# Patient Record
Sex: Male | Born: 1985
Health system: Southern US, Community
[De-identification: ages and names within clinical notes are randomized; demographics above are authoritative.]

## PROBLEM LIST (undated history)

## (undated) DIAGNOSIS — K219 Gastro-esophageal reflux disease without esophagitis: Secondary | ICD-10-CM

## (undated) DIAGNOSIS — R569 Unspecified convulsions: Secondary | ICD-10-CM

## (undated) DIAGNOSIS — E785 Hyperlipidemia, unspecified: Secondary | ICD-10-CM

## (undated) DIAGNOSIS — G5600 Carpal tunnel syndrome, unspecified upper limb: Secondary | ICD-10-CM

## (undated) DIAGNOSIS — F419 Anxiety disorder, unspecified: Secondary | ICD-10-CM

## (undated) DIAGNOSIS — K900 Celiac disease: Secondary | ICD-10-CM

## (undated) DIAGNOSIS — R0789 Other chest pain: Secondary | ICD-10-CM

## (undated) DIAGNOSIS — E1110 Type 2 diabetes mellitus with ketoacidosis without coma: Secondary | ICD-10-CM

## (undated) DIAGNOSIS — L0591 Pilonidal cyst without abscess: Secondary | ICD-10-CM

## (undated) DIAGNOSIS — E039 Hypothyroidism, unspecified: Secondary | ICD-10-CM

## (undated) HISTORY — DX: Anxiety disorder, unspecified: F41.9

## (undated) HISTORY — DX: Type 2 diabetes mellitus with ketoacidosis without coma: E11.10

## (undated) HISTORY — DX: Gastro-esophageal reflux disease without esophagitis: K21.9

## (undated) HISTORY — DX: Celiac disease: K90.0

## (undated) HISTORY — DX: Hyperlipidemia, unspecified: E78.5

## (undated) HISTORY — DX: Hypothyroidism, unspecified: E03.9

## (undated) HISTORY — DX: Unspecified convulsions: R56.9

## (undated) HISTORY — DX: Pilonidal cyst without abscess: L05.91

## (undated) HISTORY — DX: Carpal tunnel syndrome, unspecified upper limb: G56.00

## (undated) HISTORY — PX: OTHER SURGICAL HISTORY: SHX169

## (undated) HISTORY — DX: Other chest pain: R07.89

---
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
## 2007-04-05 DIAGNOSIS — G5600 Carpal tunnel syndrome, unspecified upper limb: Secondary | ICD-10-CM

## 2007-04-05 HISTORY — DX: Carpal tunnel syndrome, unspecified upper limb: G56.00

## 2007-11-24 ENCOUNTER — Emergency Department (HOSPITAL_COMMUNITY): Admission: EM | Admit: 2007-11-24 | Discharge: 2007-11-24 | Payer: Self-pay | Admitting: Emergency Medicine

## 2008-05-17 ENCOUNTER — Emergency Department (HOSPITAL_COMMUNITY): Admission: EM | Admit: 2008-05-17 | Discharge: 2008-05-17 | Payer: Self-pay | Admitting: Emergency Medicine

## 2009-05-31 ENCOUNTER — Emergency Department (HOSPITAL_COMMUNITY): Admission: EM | Admit: 2009-05-31 | Discharge: 2009-05-31 | Payer: Self-pay | Admitting: Family Medicine

## 2010-04-25 ENCOUNTER — Encounter: Payer: Self-pay | Admitting: Endocrinology

## 2010-06-25 LAB — POCT INFECTIOUS MONO SCREEN: Mono Screen: POSITIVE — AB

## 2010-06-25 LAB — POCT RAPID STREP A (OFFICE): Streptococcus, Group A Screen (Direct): NEGATIVE

## 2010-07-20 LAB — BASIC METABOLIC PANEL
BUN: 19 mg/dL (ref 6–23)
CO2: 26 mEq/L (ref 19–32)
Calcium: 9.2 mg/dL (ref 8.4–10.5)
GFR calc non Af Amer: >60 mL/min (ref >60)
Glucose, Bld: 287 mg/dL — ABNORMAL HIGH (ref 70–99)
Potassium: 4.1 mEq/L (ref 3.5–5.1)
Sodium: 134 mEq/L — ABNORMAL LOW (ref 135–145)

## 2010-07-20 LAB — DIFFERENTIAL
Basophils Absolute: 0.1 10*3/uL (ref 0.0–0.1)
Basophils Relative: 1 % (ref 0–1)
Eosinophils Relative: 1 % (ref 0–5)
Lymphocytes Relative: 6 % — ABNORMAL LOW (ref 12–46)
Monocytes Absolute: 0.8 10*3/uL (ref 0.1–1.0)

## 2010-07-20 LAB — URINALYSIS, ROUTINE W REFLEX MICROSCOPIC
Bilirubin Urine: NEGATIVE
Glucose, UA: >1000 mg/dL — AB
Hgb urine dipstick: NEGATIVE
Ketones, ur: 40 mg/dL — AB
Leukocytes, UA: NEGATIVE
Nitrite: NEGATIVE
Protein, ur: NEGATIVE mg/dL
Specific Gravity, Urine: 1.04 — ABNORMAL HIGH (ref 1.005–1.030)
Urobilinogen, UA: 1 mg/dL (ref 0.0–1.0)
pH: 6 (ref 5.0–8.0)

## 2010-07-20 LAB — GLUCOSE, CAPILLARY: Glucose-Capillary: 278 mg/dL — ABNORMAL HIGH (ref 70–99)

## 2010-07-20 LAB — CBC
HCT: 46 % (ref 39.0–52.0)
Hemoglobin: 16.4 g/dL (ref 13.0–17.0)
MCHC: 35.6 g/dL (ref 30.0–36.0)
Platelets: 207 10*3/uL (ref 150–400)
RDW: 12.3 % (ref 11.5–15.5)

## 2010-07-20 LAB — URINE MICROSCOPIC-ADD ON

## 2010-07-20 LAB — MAGNESIUM: Magnesium: 2 mg/dL (ref 1.5–2.5)

## 2010-08-10 ENCOUNTER — Ambulatory Visit (HOSPITAL_COMMUNITY)
Admission: EM | Admit: 2010-08-10 | Discharge: 2010-08-11 | Discharge status: Against Medical Advice | Payer: 59 | Attending: Emergency Medicine | Admitting: Emergency Medicine

## 2010-08-10 DIAGNOSIS — R109 Unspecified abdominal pain: Secondary | ICD-10-CM | POA: Insufficient documentation

## 2010-08-10 DIAGNOSIS — E119 Type 2 diabetes mellitus without complications: Secondary | ICD-10-CM | POA: Insufficient documentation

## 2010-08-10 LAB — GLUCOSE, CAPILLARY: Glucose-Capillary: 162 mg/dL — ABNORMAL HIGH (ref 70–99)

## 2010-08-20 NOTE — Discharge Summary (Signed)
NAME:  [name], [name]                        ACCOUNT NO.:  [id number]   MEDICAL RECORD NO.:  [id number]                   PATIENT TYPE:  INP   LOCATION:  6127                                 FACILITY:  [facility]   PHYSICIAN:  Gerrianne Scale, M.D.            DATE OF BIRTH:  1986-02-04   DATE OF ADMISSION:  04/26/2002  DATE OF DISCHARGE:  04/29/2002                                 DISCHARGE SUMMARY   CONSULTATIONS:  Dr. Evlyn Kanner, endocrinology.   PRINCIPAL DIAGNOSES:  1. Diabetic ketoacidosis.  2. Type 1 diabetes.   PROCEDURE:  None.   LABORATORY DATA:  ABG on admission was 7.35 with a PCO2 of 42.2 and a CO2 of  25.  Chemistry shows a sodium of 132, potassium 4.2, chloride 105, CO2 23,  BUN 24, creatinine 0.9.  Urinalysis showed moderate ketones, negative WBC  and large glucose. At the time of discharge chemistry showed a sodium 136,  potassium 4.1, chloride 110, bicarbonate of 20, BUN 11, creatinine 0.8.  Urine ketones were negative.  Hemoglobin A1c was 9.7.   HOSPITAL COURSE:  In brief patient is a 25 year old Caucasian male with a  history of type 1 diabetes times five years who presented for admission on  April 27, 2002 with several episodes of vomiting and abdominal pain.  The  patient's blood sugars at homoe were running 500 to 600.  The patient had  polydipsia and polyuria, therefore, patient was admitted for further  evaluation and observation.   Once hospitalized patient was noted to have elevated anion gap and large  urine ketones therefore, patient was admitted to the pediatric intensive  care unit for insulin therapy infusion.  The patient's home insulin pump was  discontinued and the patient was started on an insulin drip of 0.05 units  per kilo per hour.  The patient was aggressively rehydrated with  approximately two times maintenance IV fluid.  Over the course of  approximately 10 hours the patient's gap closed and insulin drip was  discontinued.  The patient  was restarted on his home insulin pump regimen  and sliding scale, carb counting.  At the time of discharge the patient was  tolerating a regular diet with no evidence of vomiting or abdominal pain.  Blood sugars ranged between 118 to 232 in the 24 hours preceding discharge.  The patient's primary endocrinologist, Dr. Evlyn Kanner and her colleagues were  available to consult during this hospitalization.  They agreed that at this  time they would defer from making any adjustments in his current regimen.   DISCHARGE INSTRUCTIONS:  The patient will be discharged home on a regular  diet with carb counting as previously instructed.   DISCHARGE MEDICATIONS:  Novolog insulin pump at 1.5 units an hour from 12  a.m. to 8 a.m., 0.9 units an hour from 8 a.m. to 2 p.m. and 0.7 units from 2  p.m. to 12 a.m.  In addition, patient  will carb count from 7 a.m. to 3 p.m.  with total carbs divided by 8 and from 2 p.m. to bedtime total carbs divided  by 10.  The patient also has a sliding scale for correction bolus which his  total blood sugar -100 (divided by 30).   DISCHARGE INSTRUCTIONS:  The patient was instructed to return to the  hospital or primary care physician for persistent vomiting, decreased  alertness or abdominal pain.    FOLLOW UP:  The patient will follow up with Dr. Evlyn Kanner in the near future.  Dr. Evlyn Kanner will notify patient regarding follow up appointment.     Oswaldo Done, M.D.    AC/MEDQ  D:  04/29/2002  T:  04/29/2002  Job:  [id number]

## 2010-08-20 NOTE — Discharge Summary (Signed)
NAME:  [name], [name]              ACCOUNT NO.:  [id number]   MEDICAL RECORD NO.:  [id number]          PATIENT TYPE:  INP   LOCATION:  A332                          FACILITY:  [facility]   PHYSICIAN:  Scott A. Gerda Diss, MD    DATE OF BIRTH:  Nov 26, 1985   DATE OF ADMISSION:  05/10/2004  DATE OF DISCHARGE:  02/08/2006LH                                 DISCHARGE SUMMARY   DISCHARGE DIAGNOSES:  1.  Dehydration.  2.  Gastroenteritis.  3.  Viral syndrome.  4.  Chronic sinusitis.  5.  Juvenile insulin-dependent diabetes mellitus type 1 with elevated      glucoses.   HOSPITAL COURSE:  The patient was admitted in after having a poor p.o.  intake of a viral illness over the past week with some intermittent vomiting  the past couple of days, poor urinary output, and orthostatic in the office.  Weight from May 04, 2004-May 10, 2004 went from 143 to 139.  The  patient was admitted in, given IV fluids, and IV antibiotics.  The patient  gradually improved.  Glucoses came down.  The patient was able to be kept on  a couple of shots NPH/day along with sliding scale.  He is usually on an  insulin pump at home.  He was treated with antibiotics.  He was able to keep  things down and the 8th, he was considered stable enough to be sent home.  He was discharged to home on antibiotics.   FOLLOW UP:  He was instructed to follow up in the office in approximately  one week, sooner if problems.  Otherwise follow up with Dr. Evlyn Kanner for a  diabetes check up.  He will finish up his Augmentin twice a day for the next  seven days.      SAL/MEDQ  D:  05/25/2004  T:  05/25/2004  Job:  [id number]

## 2010-08-20 NOTE — H&P (Signed)
NAME:  [name], [name]                        ACCOUNT NO.:  [id number]   MEDICAL RECORD NO.:  [id number]                   PATIENT TYPE:  OBV   LOCATION:  A329                                 FACILITY:  [facility]   PHYSICIAN:  Scott A. Gerda Diss, M.D.               DATE OF BIRTH:  07-28-1985   DATE OF ADMISSION:  05/27/2003  DATE OF DISCHARGE:                                HISTORY & PHYSICAL   CHIEF COMPLAINT:  Fever, nausea, dizziness.   HISTORY OF PRESENT ILLNESS:  This is a 25 year old male who presents with  fever, headache, muscle aches, slight cough, moderate nausea, mild head  congestion, not feeling good; and recently had wisdom teeth taken out on  Friday.  Placed on penicillin 4 times a day.  Also using Entex as needed for  congestion and Motrin for fever.  Felt dizzy when standing up the past 1-1/2  days, not urinating as much as normal.  Sugars have been n a good range.  He  is an insulin-dependent diabetic with an insulin pump.   PAST MEDICAL HISTORY:  Insulin-dependent diabetes with insulin pump.  Normal  pediatric health otherwise. Up to date on immunizations.   FAMILY HISTORY:  Noncontributory.   REVIEW OF SYSTEMS:  See per above.   PHYSICAL EXAMINATION:  GENERAL:  NAD.  HEENT:  MM moist.  NECK:  Supple.  CHEST:  CTA.  HEART:  Regular.  ABDOMEN:  Soft.  No guarding, no rebound.  EXTREMITIES:  No edema.   MET-7:  Potassium looks good, sodium low at 133, BUN and creatinine looks  good. Serum ketones are negative.  White count is normal.  Urine ketones 2+.  The patient is orthostatic in the office.   ASSESSMENT AND PLAN:  1. Febrile viral illness--should gradual resolve over the course of the next     48 hours hopefully.  2. Insulin-dependent diabetes.  Good control currently, but certainly high-     risk for diabetic ketoacidosis with 2+ urine ketones feel that the     patient does need IV fluids.  Especially since he is nauseated and can     not keep anything down  and has a history of diabetic ketoacidosis.  Admit     him to the hospital and treat accordingly--see orders.    ___________________________________________                                         Jonna Coup Gerda Diss, M.D.   Linus Orn  D:  05/27/2003  T:  05/27/2003  Job:  [id number]

## 2010-08-20 NOTE — Discharge Summary (Signed)
NAME:  [name], [name]                        ACCOUNT NO.:  [id number]   MEDICAL RECORD NO.:  [id number]                   PATIENT TYPE:  OBV   LOCATION:  A329                                 FACILITY:  [facility]   PHYSICIAN:  Scott A. Gerda Diss, M.D.               DATE OF BIRTH:  1985-04-26   DATE OF ADMISSION:  05/27/2003  DATE OF DISCHARGE:                                 DISCHARGE SUMMARY   DISCHARGE DIAGNOSES:  1. Febrile illness and viral syndrome.  2. Gastroenteritis.  3. Diabetes type I, insulin dependent.  4. Ketonuria.   HOSPITAL COURSE:  A 25 year old male who was admitted after 1-1/2 days of  fever, nausea, unable to eat anything and drink anything.  Decreased  urination and ketones in his urine.  Is a type I diabetic and was  orthostatic in the office.  Admitted in for IV fluids.  Serum ketones were  negative.  Laboratory work was followed closely and no abnormalities.  The  patient had improved urination and hydration after IV fluids, was able to  nibble some on the evening February 22.  On the morning of February 23, able  to eat better and drink better, was stable and felt able to be discharged to  home.   DISPOSITION:  Discharged to home.   DISCHARGE INSTRUCTIONS:  Instructed to follow up in the office.   CONDITION ON DISCHARGE:  Ongoing troubles are high glucoses.     ___________________________________________                                         Jonna Coup Gerda Diss, M.D.   Linus Orn  D:  05/28/2003  T:  05/28/2003  Job:  [id number]

## 2010-08-20 NOTE — H&P (Signed)
NAME:  [name], [name]              ACCOUNT NO.:  [id number]   MEDICAL RECORD NO.:  [id number]          PATIENT TYPE:  INP   LOCATION:  A332                          FACILITY:  [facility]   PHYSICIAN:  Scott A. Gerda Diss, MD    DATE OF BIRTH:  [birth year]   DATE OF ADMISSION:  05/10/2004  DATE OF DISCHARGE:  LH                                HISTORY & PHYSICAL   CHIEF COMPLAINT:  Vomiting, poor p.o. intake, weight loss, upper respiratory  symptoms.   HISTORY OF PRESENT ILLNESS:  This is a 25 year old white male who is a type  1 diabetic who has had approximately a 7-day history of head congestion,  drainage, cough, sore throat, vomiting times one, poor p.o. intake, poor  liquid intake, feeling very fatigued also having significant weakness,  states sugars are anywhere from the 200 to 400 range.  The patient was seen  on January 31 and was 143.6, then seen on February 6 and was 139.6.  Mother  had mother-in-law bring the child in to be seen on February 6.  He was seen  by our nurse practitioner, was given a shot of Rocephin and started on  antibiotics.  The patient throughout the rest of the day though was very  lethargic.  Mother called back stating the young man was lethargic, not  eating well, not drinking well.  She could not get him to even drink having  a hard time getting him more awake.  Stated his sugars were running in the  4000 range.  She had to give him boluses of insulin to bring it down.  At  this point the mother was instructed to bring the child back in.  The child  was admitted into the hospital with mild dehydration and weight loss and  poor control of type 1 diabetes along with upper respiratory illness.   PAST MEDICAL HISTORY:  1.  Type 1 diabetes since 1999.  2.  Febrile seizures as a toddler.  3.  Frequent otitis as a toddler.  4.  Up to date on immunizations including flu vaccine.   ALLERGIES:  CODEINE makes him nauseous.   MEDICATIONS:  Zoloft 50 mg daily.  Patient  also on insulin pump managed by  Dr. Evlyn Kanner.   PHYSICAL EXAMINATION:  TM's NL, C-NL, sinus mild tenderness.  NECK:  Supple.  LUNGS:  Clear.  HEART:  Regular.  ABDOMEN:  Soft.   ASSESSMENT/PLAN:  1.  Mild dehydration with weight loss. Give a  bolus of IV fluids and then      maintenance IV fluids.  Encourage p.o. intake.  2.  Diabetes, poor control.  Place on sliding scale along with two doses of      NPH per day.  Disengage pump while      in hospital.  Encourage p.o. intake.  3.  Sinusitis.  Rocephin 1 g IV daily.  4.  Check for ketones as well.      SAL/MEDQ  D:  05/11/2004  T:  05/11/2004  Job:  [id number]

## 2010-08-20 NOTE — Consult Note (Signed)
[facility]  Patient:    [name], [name] Visit Number: [id number] MRN: [id number]          Service Type: PED Location: PEDS [phone] Attending Physician:  Julian Hy Dictated by:   Rodrigo Ran, M.D. Admit Date:  07/02/2001   CC:         Lilyan Punt, M.D.   Consultation Report  PRIMARY CARE PHYSICIAN:  Pediatrician, Lilyan Punt, M.D.  ENDOCRINOLOGY:  Tera Mater. [place], M.D.  CHIEF COMPLAINT:  Hyperglycemia and nausea.  HISTORY OF PRESENT ILLNESS:  The patient is a pleasant 25 year old young man with type 1 diabetes for the last four years. He had an episode approximately one month ago, which began with the abrupt onset of hyperglycemia followed by nausea and vomiting. At that time, he was treated in the emergency room with 2 liters of saline intravenously with prompt resolution of his symptoms. He had done well up until Sunday evening when he developed a similar syndrome. This began with elevated blood sugars on Sunday evening up in the 400s, and then to high readings which are normally greater than 500 with his glucometer. He began giving corrective boluses; however, his sugars remained high and he developed x1 episode on early Monday morning. Throughout the morning hours of Monday, he continued to have high blood sugars in the 400s or more despite repeated corrective boluses of insulin. This did not improve even with changes of his insulin pump site and changes to a new vial of insulin. Subsequently, he was brought to the emergency room where he was found to be dehydrated and hyperglycemic without evidence of diabetic ketoacidosis. He was treated with insulin and 4 liters of normal saline. He was able to keep down oral intake and was therefore discharged home with a blood sugar of 330. Despite this, within 2-3 hours his sugars rapidly went back up into upper of 400s to the high readings, and despite a few more corrective boluses  his sugars remained quite high. Therefore, he presented back to the emergency room and was required admission for treatment of his hyperglycemia.  PAST MEDICAL HISTORY: 1. Type 1 diabetes since age 89. 2. No surgical history. 3. Admitted on April 01, 2001 with a low sugar seizure. Last admission in    the emergency room was approximately one month ago, with hyperglycemia    followed by nausea, vomiting, and this resolved quickly with treatment as    noted above.  ALLERGIES:  CODEINE which causes him to become wild.  MEDICATIONS:  He is on a NovoLog insulin pump with occasional corrective doses of Humalog given subcutaneously. He is on no other medicines but did take one dose of Phenergan in the last 24 hours. Between breakfast and 3 p.m. he normally divides total carbohydrates by 8 to give premeal boluses. His basal insulin rate is 0.8 units per hour of NovoLog. After 3 p.m., he divides his carbohydrate total by 10 with each meal. His high correction, if his sugars are greater than 200, he subtracts 100, and then divides this by 30 and gives this as an additional corrective amount of insulin. Typically, he uses 30-35 units of total insulin a day in addition to his basal rate. He has used well over 60 units in the last 24 hours.  SOCIAL HISTORY:  He has one younger brother named Gerri Spore, who is 33 years old. The patient is an Insurance underwriter at [employer]. His parents are divorced. His mother is alive  at age 28 and is healthy except for hypertension. His father is alive and did have some liver problem previously but is currently doing well.  REVIEW OF SYSTEMS:  Joushua felt quite well up until the last 24 hours. He has had no recent prodromal symptoms, or fevers, chills, or nausea and vomiting prior to this episode. He has had normal bowel movements and no GU complaints.   PHYSICAL EXAMINATION:  VITAL SIGNS:  Blood sugar 448. Now, with 1-1/2 hours of 6 units per hour  of regular insulin drip, it is down to 306. His blood pressure is 126/62, pulse 72, temperature 97.3, 98% saturation on room air with a respiratory rate of 18.  GENERAL:  He is semisupine in no acute distress.  HEENT:  There is no icterus. Pupils are equally round and reactive to light. Extraocular movements are intact.  LUNGS:  Clear to auscultation bilaterally.  HEART:  Regular with no murmur, rub, or gallop.  ABDOMEN:  Soft and mildly tender. There are normal active bowel sounds. There is no hepatosplenomegaly. Pump site is without sign of infection.  EXTREMITIES:  There is no cyanosis, clubbing, or edema.  LABORATORY AND ACCESSORY DATA:  ABG with a pH of 7.39 from this afternoon, with a CO2 of 40 and a bicarb of 24. Hemoglobin was 14.3, platelet count 282,000, white count 6.2 with a normal differential. Sodium was low at 127, potassium 4.0, chloride 95, CO2 23, BUN 18, creatinine 0.8, glucose 525 with normal LFTs.  ASSESSMENT AND PLAN:  A 25 year old male patient with type 1 diabetes with persistent hyperglycemia of unclear cause. This is not responsive to changing pump site and changing vials of insulin. There is no clear sign of diabetic ketoacidosis, but the patient may be in a hyperosmolar nonketotic state. We will treat with normal saline intravenously at 50 cc an hour and a regular insulin drip. As his CBGs come under 200, we will add in a D-5 water drip. We will ask our pediatrics teaching service to help facilitate his admission. We will follow the patient during this admission. Dictated by:   Rodrigo Ran, M.D. Attending Physician:  Julian Hy DD:  07/03/01 TD:  07/03/01 Job: [id number]

## 2010-12-20 ENCOUNTER — Emergency Department (HOSPITAL_COMMUNITY)
Admission: EM | Admit: 2010-12-20 | Discharge: 2010-12-21 | Disposition: A | Discharge status: Home / Self Care | Payer: 59 | Attending: Emergency Medicine | Admitting: Emergency Medicine

## 2010-12-20 ENCOUNTER — Encounter: Payer: Self-pay | Admitting: *Deleted

## 2010-12-20 DIAGNOSIS — M25559 Pain in unspecified hip: Secondary | ICD-10-CM | POA: Insufficient documentation

## 2010-12-20 DIAGNOSIS — F17200 Nicotine dependence, unspecified, uncomplicated: Secondary | ICD-10-CM | POA: Insufficient documentation

## 2010-12-20 DIAGNOSIS — E119 Type 2 diabetes mellitus without complications: Secondary | ICD-10-CM | POA: Insufficient documentation

## 2010-12-20 DIAGNOSIS — L539 Erythematous condition, unspecified: Secondary | ICD-10-CM | POA: Insufficient documentation

## 2010-12-20 DIAGNOSIS — IMO0002 Reserved for concepts with insufficient information to code with codable children: Secondary | ICD-10-CM | POA: Insufficient documentation

## 2010-12-20 DIAGNOSIS — Y92410 Unspecified street and highway as the place of occurrence of the external cause: Secondary | ICD-10-CM | POA: Insufficient documentation

## 2010-12-20 LAB — GLUCOSE, CAPILLARY: Glucose-Capillary: 63 mg/dL — ABNORMAL LOW (ref 70–99)

## 2010-12-20 NOTE — ED Notes (Signed)
MD at bedside to evaluate. Patient logrolled off of backboard while maintaining C-Spine. Denies any needs at this time.
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
## 2010-12-20 NOTE — ED Notes (Signed)
fbs 54 by ems.

## 2010-12-21 ENCOUNTER — Emergency Department (HOSPITAL_COMMUNITY)
Admission: EM | Admit: 2010-12-21 | Discharge: 2010-12-21 | Disposition: A | Discharge status: Home / Self Care | Payer: No Typology Code available for payment source | Attending: Emergency Medicine | Admitting: Emergency Medicine

## 2010-12-21 ENCOUNTER — Emergency Department (HOSPITAL_COMMUNITY): Payer: No Typology Code available for payment source

## 2010-12-21 ENCOUNTER — Emergency Department (HOSPITAL_COMMUNITY): Payer: 59

## 2010-12-21 ENCOUNTER — Encounter (HOSPITAL_COMMUNITY): Payer: Self-pay | Admitting: Emergency Medicine

## 2010-12-21 DIAGNOSIS — R51 Headache: Secondary | ICD-10-CM

## 2010-12-21 DIAGNOSIS — S20219A Contusion of unspecified front wall of thorax, initial encounter: Secondary | ICD-10-CM

## 2010-12-21 LAB — GLUCOSE, CAPILLARY: Glucose-Capillary: 165 mg/dL — ABNORMAL HIGH (ref 70–99)

## 2010-12-21 MED ORDER — HYDROCODONE-ACETAMINOPHEN 7.5-500 MG/15ML PO SOLN: 15.0000 mL | Freq: Four times a day (QID) | ORAL | Status: AC | PRN

## 2010-12-21 MED ORDER — HYDROCODONE-ACETAMINOPHEN 5-325 MG PO TABS
1.0000 | ORAL_TABLET | Freq: Once | ORAL | Status: AC
  Administered 2010-12-21: 1 via ORAL
  Filled 2010-12-21: qty 1

## 2010-12-21 MED ORDER — METHOCARBAMOL 500 MG PO TABS
1000.0000 mg | ORAL_TABLET | Freq: Once | ORAL | Status: AC
  Administered 2010-12-21: 1000 mg via ORAL
  Filled 2010-12-21: qty 2

## 2010-12-21 MED ORDER — CYCLOBENZAPRINE HCL 10 MG PO TABS: 10.0000 mg | ORAL_TABLET | Freq: Two times a day (BID) | ORAL | Status: AC | PRN

## 2010-12-21 MED ORDER — IBUPROFEN 800 MG PO TABS
800.0000 mg | ORAL_TABLET | Freq: Once | ORAL | Status: AC
  Administered 2010-12-21: 800 mg via ORAL
  Filled 2010-12-21: qty 1

## 2010-12-21 MED ORDER — IBUPROFEN 800 MG PO TABS: 800.0000 mg | ORAL_TABLET | Freq: Three times a day (TID) | ORAL | Status: AC

## 2010-12-21 NOTE — ED Notes (Signed)
Into room to see patient. Denies any pain. Given one can diet coke per request. Denies any other needs. Will continue to monitor.

## 2010-12-21 NOTE — ED Notes (Signed)
No deformity to left upper thigh. Strong petal pulses.

## 2010-12-21 NOTE — ED Notes (Signed)
MD at bedside to speak with patient about plan of care.

## 2010-12-21 NOTE — ED Notes (Signed)
Pt was seen here last night for the same. Pt c/o rt rib pain, head pain, and left leg pain.
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
## 2010-12-21 NOTE — ED Notes (Signed)
Patient ambulated well in hall with no difficulties.

## 2010-12-21 NOTE — ED Notes (Signed)
Patient to radiology via stretcher

## 2010-12-21 NOTE — ED Notes (Signed)
Patient back to room from radiology. Pain 0\10. Denies any needs at this time. Call bell and family at bedside.
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
## 2010-12-28 NOTE — ED Provider Notes (Signed)
Medical screening examination/treatment/procedure(s) were performed by non-physician practitioner and as supervising physician I was immediately available for consultation/collaboration.   Keldric Poyer R Kharee Lesesne, MD 12/28/10 1121 

## 2011-01-21 ENCOUNTER — Emergency Department (HOSPITAL_COMMUNITY): Payer: No Typology Code available for payment source

## 2011-01-21 ENCOUNTER — Emergency Department (HOSPITAL_COMMUNITY)
Admission: EM | Admit: 2011-01-21 | Discharge: 2011-01-22 | Disposition: A | Discharge status: Home / Self Care | Payer: No Typology Code available for payment source | Attending: Emergency Medicine | Admitting: Emergency Medicine

## 2011-01-21 ENCOUNTER — Encounter (HOSPITAL_COMMUNITY): Payer: Self-pay | Admitting: *Deleted

## 2011-01-21 DIAGNOSIS — R071 Chest pain on breathing: Secondary | ICD-10-CM | POA: Insufficient documentation

## 2011-01-21 DIAGNOSIS — R0789 Other chest pain: Secondary | ICD-10-CM

## 2011-01-21 DIAGNOSIS — S161XXA Strain of muscle, fascia and tendon at neck level, initial encounter: Secondary | ICD-10-CM

## 2011-01-21 DIAGNOSIS — IMO0001 Reserved for inherently not codable concepts without codable children: Secondary | ICD-10-CM | POA: Insufficient documentation

## 2011-01-21 DIAGNOSIS — F17200 Nicotine dependence, unspecified, uncomplicated: Secondary | ICD-10-CM | POA: Insufficient documentation

## 2011-01-21 DIAGNOSIS — E119 Type 2 diabetes mellitus without complications: Secondary | ICD-10-CM | POA: Insufficient documentation

## 2011-01-21 DIAGNOSIS — S139XXA Sprain of joints and ligaments of unspecified parts of neck, initial encounter: Secondary | ICD-10-CM | POA: Insufficient documentation
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
## 2011-01-22 ENCOUNTER — Encounter (HOSPITAL_COMMUNITY): Payer: Self-pay | Admitting: Emergency Medicine

## 2011-01-22 MED ORDER — METHOCARBAMOL 500 MG PO TABS: ORAL_TABLET | ORAL | Status: DC

## 2011-01-22 MED ORDER — IBUPROFEN 800 MG PO TABS: 800.0000 mg | ORAL_TABLET | Freq: Three times a day (TID) | ORAL | Status: AC
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
## 2011-01-22 NOTE — ED Notes (Signed)
Pt left the er stating no needs 

## 2011-02-01 NOTE — ED Provider Notes (Signed)
Medical screening examination/treatment/procedure(s) were performed by non-physician practitioner and as supervising physician I was immediately available for consultation/collaboration.  Atianna Haidar, MD 02/01/11 1713 

## 2011-03-11 ENCOUNTER — Ambulatory Visit (HOSPITAL_COMMUNITY)
Admission: RE | Admit: 2011-03-11 | Discharge: 2011-03-11 | Disposition: A | Discharge status: Home / Self Care | Payer: No Typology Code available for payment source | Source: Ambulatory Visit | Attending: Family Medicine | Admitting: Family Medicine

## 2011-03-11 DIAGNOSIS — IMO0001 Reserved for inherently not codable concepts without codable children: Secondary | ICD-10-CM | POA: Insufficient documentation

## 2011-03-11 DIAGNOSIS — M546 Pain in thoracic spine: Secondary | ICD-10-CM | POA: Insufficient documentation

## 2011-03-11 DIAGNOSIS — E119 Type 2 diabetes mellitus without complications: Secondary | ICD-10-CM | POA: Insufficient documentation

## 2011-03-11 DIAGNOSIS — M542 Cervicalgia: Secondary | ICD-10-CM | POA: Insufficient documentation
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
## 2011-03-16 ENCOUNTER — Ambulatory Visit (HOSPITAL_COMMUNITY)
Admission: RE | Admit: 2011-03-16 | Discharge: 2011-03-16 | Disposition: A | Discharge status: Home / Self Care | Payer: No Typology Code available for payment source | Source: Ambulatory Visit | Attending: Family Medicine | Admitting: Family Medicine

## 2011-03-16 NOTE — Progress Notes (Signed)
Physical Therapy Treatment Patient Details  Name: [name] MRN: [id number] Date of Birth: 04-25-1985  Today's Date: 03/16/2011 Time: 4098-1191 Time Calculation (min): 42 min Visit#: 2  of 6   Re-eval: 04/01/11 Charges:  therex 24', manual (STM) 15'    Subjective: Symptoms/Limitations Symptoms: Pt. states last visit helped.  Reports his pain is about a 3/10 in his R rib cage area. Pain Assessment Currently in Pain?: Yes Pain Score:   3 Pain Location: Thoracic Pain Orientation: Right  Exercise/Treatments Stretches Active Hamstring Stretch: 2 reps;30 seconds Active Hamstring Stretch Limitations: BLE Lower Trunk Rotation: 5 reps;20 seconds Lower Trunk Rotation Limitations: Thoracic rotation stretch Prone Mid Back Stretch: 3 reps;30 seconds;Limitations Prone Mid Back Stretch Limitations: seated ITB Stretch: 2 reps;30 seconds ITB Stretch Limitations: BLE Machine Exercises UBE (Upper Arm Bike): 4' backward only  Manual Therapy Manual Therapy:  (Prone position X 15 minutes)  Physical Therapy Assessment and Plan PT Assessment and Plan Clinical Impression Statement: Pt. reported painfree following STM; Large spasms B scapular and L trap area.  Able to resolve all 100% except L scapular spasm.  Added midback stretch without difficulty. PT Plan: Assess pain next visit; add tband postural three and progress to prone scapular/thoracic strengthening.     Problem List Patient Active Problem List  Diagnoses  . Pain in thoracic spine  . Cervical pain    PT - End of Session Activity Tolerance: Patient tolerated treatment well General Behavior During Session: [facility] for tasks performed Cognition: [facility] for tasks performed  Emeline Gins B 03/16/2011, 8:53 AM

## 2011-03-18 ENCOUNTER — Ambulatory Visit (HOSPITAL_COMMUNITY)
Admission: RE | Admit: 2011-03-18 | Discharge: 2011-03-18 | Disposition: A | Discharge status: Home / Self Care | Payer: No Typology Code available for payment source | Source: Ambulatory Visit

## 2011-03-18 NOTE — Progress Notes (Signed)
Physical Therapy Treatment Patient Details  Name: [name] MRN: [id number] Date of Birth: 1986/01/05  Today's Date: 03/18/2011 Time: 1478-2956 Time Calculation (min): 40 min Visit#: 3  of 6   Re-eval: 04/01/11  Charge: therex 26 min Manual 12 min  Subjective: Symptoms/Limitations Symptoms: Pt stated shoulders are feeling fine, only pain R rib cage pain scale 2-3/10. Pain Assessment Currently in Pain?: Yes Pain Score:   3 Pain Location: Rib cage Pain Orientation: Right  Objective:   Exercise/Treatments Stretches Active Hamstring Stretch: 2 reps;30 seconds Active Hamstring Stretch Limitations: BLE Lower Trunk Rotation: 5 reps;30 seconds Lower Trunk Rotation Limitations: Thoracic rotation stretch Prone Mid Back Stretch: 2 reps;60 seconds ITB Stretch: 2 reps;30 seconds ITB Stretch Limitations: BLE Lumbar Exercises Scapular Retraction: AROM;10 reps;Standing;Theraband;Limitations Theraband Level (Scapular Retraction): Level 3 (Green) Scapular Retraction Limitations: 5" holds Row: 10 reps;Standing;Theraband;Limitations;Prone Theraband Level (Row): Level 3 (Green) Row Limitations: 5" holds Shoulder Extension: 10 reps;Standing;Theraband Theraband Level (Shoulder Extension): Level 3 (Green)  Manual Therapy Manual Therapy: Other (comment) Other Manual Therapy: STM B scapular, upper trap and R rib cage x 12 min  Physical Therapy Assessment and Plan PT Assessment and Plan Clinical Impression Statement: Added tband postural strengthening exercises, pt able to complete correctly with min cueing for proper form.  Large spasms L scapular and upper trap area, R rib cage, able to resolve with relief stated following. PT Plan: Assess pain next session.  Continue progressing scapular/thoracic strengthening/stretches.    Goals    Problem List Patient Active Problem List  Diagnoses  . Pain in thoracic spine  . Cervical pain    PT - End of Session Activity Tolerance:  Patient tolerated treatment well General Behavior During Session: [facility] for tasks performed Cognition: [facility] for tasks performed  Juel Burrow 03/18/2011, 2:05 PM

## 2011-03-22 ENCOUNTER — Telehealth (HOSPITAL_COMMUNITY): Payer: Self-pay

## 2011-03-22 ENCOUNTER — Ambulatory Visit (HOSPITAL_COMMUNITY): Payer: 59

## 2011-03-25 ENCOUNTER — Inpatient Hospital Stay (HOSPITAL_COMMUNITY): Admission: RE | Admit: 2011-03-25 | Payer: 59 | Source: Ambulatory Visit

## 2011-03-30 ENCOUNTER — Inpatient Hospital Stay (HOSPITAL_COMMUNITY): Admission: RE | Admit: 2011-03-30 | Payer: 59 | Source: Ambulatory Visit

## 2011-03-30 ENCOUNTER — Telehealth (HOSPITAL_COMMUNITY): Payer: Self-pay

## 2011-04-01 ENCOUNTER — Ambulatory Visit (HOSPITAL_COMMUNITY): Payer: 59 | Admitting: Physical Therapy

## 2011-04-07 ENCOUNTER — Ambulatory Visit (HOSPITAL_COMMUNITY)
Admission: RE | Admit: 2011-04-07 | Discharge: 2011-04-07 | Disposition: A | Discharge status: Home / Self Care | Payer: No Typology Code available for payment source | Source: Ambulatory Visit | Attending: Family Medicine | Admitting: Family Medicine

## 2011-04-07 DIAGNOSIS — E119 Type 2 diabetes mellitus without complications: Secondary | ICD-10-CM | POA: Insufficient documentation

## 2011-04-07 DIAGNOSIS — M542 Cervicalgia: Secondary | ICD-10-CM | POA: Insufficient documentation

## 2011-04-07 DIAGNOSIS — IMO0001 Reserved for inherently not codable concepts without codable children: Secondary | ICD-10-CM | POA: Insufficient documentation

## 2011-04-07 DIAGNOSIS — M546 Pain in thoracic spine: Secondary | ICD-10-CM | POA: Insufficient documentation

## 2011-04-07 NOTE — Progress Notes (Addendum)
Physical Therapy Treatment Patient Details  Name: [name] MRN: [id number] Date of Birth: [birth year]  Today's Date: 04/07/2011 Time: 8295-6213 Time Calculation (min): 34 min Visit#: 4  of 6   Charges: ROMM x 1 Pt education (Self care) x 8' Therex x 18'  Subjective: Symptoms/Limitations Symptoms: Pt reports that he is pain free and ready to D/C. Pain Assessment Currently in Pain?: No/denies Pain Score: 0-No pain  Objective:  04/07/11 0800  Lumbar AROM  Lumbar Flexion Decreased 10%  Lumbar Extension WNL  Lumbar - Right Side Bend WNL  Lumbar - Left Side Bend WNL  Lumbar - Right Rotation WNL  Lumbar - Left Rotation WNL     Exercise/Treatments Stretches Active Hamstring Stretch: 1 rep;20 seconds Active Hamstring Stretch Limitations: BLE for HEP Lower Trunk Rotation: 1 rep;30 seconds Lower Trunk Rotation Limitations: For HEP compliance Prone Mid Back Stretch: 1 rep;30 seconds Prone Mid Back Stretch Limitations: For HEP ITB Stretch: 1 rep;30 seconds ITB Stretch Limitations: BLE for HEP Lumbar Exercises Scapular Retraction: 10 reps Theraband Level (Scapular Retraction): Level 3 (Green) Row: 10 reps Theraband Level (Row): Level 3 (Green) Shoulder Extension: 10 reps;Standing;Theraband Theraband Level (Shoulder Extension): Level 3 (Green) Stability Single Arm Raise: 10 reps Leg Raise: 10 reps Opposite Arm/Leg Raise: 10 reps  Physical Therapy Assessment and Plan PT Assessment and Plan Clinical Impression Statement: HEP exercises reviewed to ensure proper technique. New scapular strengthening exercises completed and given for HEP. Pt complete exercises with proper form with minimal need for cueing. Pt with improved ROM and dereased spasm and pain. PT Plan: Recommend D/C to PT. Per all goals met and pt request.    Goals Home Exercise Program Pt will Perform Home Exercise Program: Independently PT Goal: Perform Home Exercise Program - Progress: Met PT Short Term  Goals Time to Complete Short Term Goals: 3 weeks PT Short Term Goal 1: Pt will report pain less than a 4/10 for 50% of his day.  PT Short Term Goal 1 - Progress: Met PT Short Term Goal 2: Pt will improve lumbar flexion by 25%. PT Short Term Goal 2 - Progress: Met PT Short Term Goal 3: Pt will present with minimal spasm to his thoracic and cervical musculature in order to have improve L cervical and thoracic rotation when driving. PT Short Term Goal 3 - Progress: Met PT Short Term Goal 4: Pt will improve postural endurance in order to maintain appropriate posture during an entire therapy regime PT Short Term Goal 4 - Progress: Met  Problem List Patient Active Problem List  Diagnoses  . Pain in thoracic spine  . Cervical pain    PT - End of Session Activity Tolerance: Patient tolerated treatment well General Behavior During Session: [facility] for tasks performed Cognition: [facility] for tasks performed  Antonieta Iba 04/07/2011, 8:54 AM

## 2012-10-11 ENCOUNTER — Telehealth: Payer: Self-pay | Admitting: Family Medicine

## 2012-10-11 NOTE — Telephone Encounter (Signed)
Patient needs new prescription for Aldara Cream.He uses [facility].

## 2012-10-12 ENCOUNTER — Other Ambulatory Visit: Payer: Self-pay | Admitting: Nurse Practitioner

## 2012-10-12 MED ORDER — IMIQUIMOD 5 % EX CREA: TOPICAL_CREAM | CUTANEOUS | Status: DC

## 2013-02-19 ENCOUNTER — Ambulatory Visit (INDEPENDENT_AMBULATORY_CARE_PROVIDER_SITE_OTHER): Payer: 59 | Admitting: Family Medicine

## 2013-02-19 ENCOUNTER — Encounter: Payer: Self-pay | Admitting: Family Medicine

## 2013-02-19 VITALS — BP 112/80 | Temp 98.2°F | Ht 67.0 in | Wt 150.2 lb

## 2013-02-19 DIAGNOSIS — N179 Acute kidney failure, unspecified: Secondary | ICD-10-CM | POA: Insufficient documentation

## 2013-02-19 DIAGNOSIS — L0390 Cellulitis, unspecified: Secondary | ICD-10-CM | POA: Insufficient documentation

## 2013-02-19 DIAGNOSIS — J019 Acute sinusitis, unspecified: Secondary | ICD-10-CM

## 2013-02-19 DIAGNOSIS — Z23 Encounter for immunization: Secondary | ICD-10-CM

## 2013-02-19 MED ORDER — HYDROCODONE-ACETAMINOPHEN 7.5-325 MG PO TABS: 1.0000 | ORAL_TABLET | Freq: Four times a day (QID) | ORAL | Status: DC | PRN

## 2013-02-19 MED ORDER — LEVOFLOXACIN 500 MG PO TABS: 500.0000 mg | ORAL_TABLET | Freq: Every day | ORAL | Status: DC

## 2013-02-19 NOTE — Addendum Note (Signed)
Addended by: Lilyan Punt A on: 02/19/2013 01:24 PM   Modules accepted: Orders, Medications

## 2013-02-19 NOTE — Progress Notes (Signed)
  Subjective:    Patient ID: [name], male    DOB: 1985-08-30, 27 y.o.   MRN: [id number]  Cough This is a new problem. The current episode started in the past 7 days. The cough is productive of sputum. Associated symptoms include nasal congestion, postnasal drip and rhinorrhea. Associated symptoms comments: Eye drainage. The symptoms are aggravated by pollens. Treatments tried: Claritin. The treatment provided no relief. His past medical history is significant for environmental allergies.   Present for a week. No V, no fevers, no wheeze   Review of Systems  HENT: Positive for postnasal drip and rhinorrhea.   Respiratory: Positive for cough.   Allergic/Immunologic: Positive for environmental allergies.       Objective:   Physical Exam  Nursing note and vitals reviewed. Constitutional: He appears well-developed.  HENT:  Head: Normocephalic.  Mouth/Throat: Oropharynx is clear and moist. No oropharyngeal exudate.  Neck: Normal range of motion.  Cardiovascular: Normal rate, regular rhythm and normal heart sounds.   No murmur heard. Pulmonary/Chest: Effort normal and breath sounds normal. He has no wheezes.  Lymphadenopathy:    He has no cervical adenopathy.  Neurological: He exhibits normal muscle tone.  Skin: Skin is warm and dry.          Assessment & Plan:  Sinusitis antibiotics prescribed warning signs discussed followup if problems Diabetes care per endocrinology

## 2013-02-23 ENCOUNTER — Encounter: Payer: Self-pay | Admitting: *Deleted

## 2013-02-25 ENCOUNTER — Ambulatory Visit: Payer: 59 | Admitting: Family Medicine

## 2013-07-25 ENCOUNTER — Encounter (HOSPITAL_COMMUNITY): Payer: Self-pay | Admitting: Emergency Medicine

## 2013-07-25 ENCOUNTER — Encounter: Payer: Self-pay | Admitting: Nurse Practitioner

## 2013-07-25 ENCOUNTER — Emergency Department (HOSPITAL_COMMUNITY)
Admission: EM | Admit: 2013-07-25 | Discharge: 2013-07-25 | Disposition: A | Discharge status: Home / Self Care | Payer: 59 | Attending: Emergency Medicine | Admitting: Emergency Medicine

## 2013-07-25 ENCOUNTER — Ambulatory Visit (INDEPENDENT_AMBULATORY_CARE_PROVIDER_SITE_OTHER): Payer: 59 | Admitting: Nurse Practitioner

## 2013-07-25 VITALS — BP 112/72 | Temp 98.3°F | Ht 67.0 in | Wt 147.2 lb

## 2013-07-25 DIAGNOSIS — Z794 Long term (current) use of insulin: Secondary | ICD-10-CM | POA: Insufficient documentation

## 2013-07-25 DIAGNOSIS — E109 Type 1 diabetes mellitus without complications: Secondary | ICD-10-CM | POA: Insufficient documentation

## 2013-07-25 DIAGNOSIS — B029 Zoster without complications: Secondary | ICD-10-CM

## 2013-07-25 DIAGNOSIS — L03019 Cellulitis of unspecified finger: Secondary | ICD-10-CM | POA: Insufficient documentation

## 2013-07-25 DIAGNOSIS — Z792 Long term (current) use of antibiotics: Secondary | ICD-10-CM | POA: Insufficient documentation

## 2013-07-25 DIAGNOSIS — F17200 Nicotine dependence, unspecified, uncomplicated: Secondary | ICD-10-CM | POA: Insufficient documentation

## 2013-07-25 DIAGNOSIS — IMO0001 Reserved for inherently not codable concepts without codable children: Secondary | ICD-10-CM

## 2013-07-25 DIAGNOSIS — L255 Unspecified contact dermatitis due to plants, except food: Secondary | ICD-10-CM | POA: Insufficient documentation

## 2013-07-25 DIAGNOSIS — L237 Allergic contact dermatitis due to plants, except food: Secondary | ICD-10-CM

## 2013-07-25 LAB — CBG MONITORING, ED: Glucose-Capillary: 194 mg/dL — ABNORMAL HIGH (ref 70–99)

## 2013-07-25 MED ORDER — SULFAMETHOXAZOLE-TRIMETHOPRIM 800-160 MG PO TABS: 1.0000 | ORAL_TABLET | Freq: Two times a day (BID) | ORAL | Status: DC

## 2013-07-25 MED ORDER — LIDOCAINE HCL (PF) 2 % IJ SOLN
10.0000 mL | Freq: Once | INTRAMUSCULAR | Status: DC
  Filled 2013-07-25: qty 10

## 2013-07-25 MED ORDER — VALACYCLOVIR HCL 1 G PO TABS: 1000.0000 mg | ORAL_TABLET | Freq: Three times a day (TID) | ORAL | Status: DC

## 2013-07-25 MED ORDER — SULFAMETHOXAZOLE-TMP DS 800-160 MG PO TABS
1.0000 | ORAL_TABLET | Freq: Once | ORAL | Status: AC
  Administered 2013-07-25: 1 via ORAL
  Filled 2013-07-25: qty 1

## 2013-07-25 NOTE — ED Notes (Signed)
Patient has abscess to right middle finger.

## 2013-07-25 NOTE — Patient Instructions (Signed)
Shingles Shingles (herpes zoster) is an infection that is caused by the same virus that causes chickenpox (varicella). The infection causes a painful skin rash and fluid-filled blisters, which eventually break open, crust over, and heal. It may occur in any area of the body, but it usually affects only one side of the body or face. The pain of shingles usually lasts about 1 month. However, some people with shingles may develop long-term (chronic) pain in the affected area of the body. Shingles often occurs many years after the person had chickenpox. It is more common:  In people older than 50 years.  In people with weakened immune systems, such as those with HIV, AIDS, or cancer.  In people taking medicines that weaken the immune system, such as transplant medicines.  In people under great stress. CAUSES  Shingles is caused by the varicella zoster virus (VZV), which also causes chickenpox. After a person is infected with the virus, it can remain in the person's body for years in an inactive state (dormant). To cause shingles, the virus reactivates and breaks out as an infection in a nerve root. The virus can be spread from person to person (contagious) through contact with open blisters of the shingles rash. It will only spread to people who have not had chickenpox. When these people are exposed to the virus, they may develop chickenpox. They will not develop shingles. Once the blisters scab over, the person is no longer contagious and cannot spread the virus to others. SYMPTOMS  Shingles shows up in stages. The initial symptoms may be pain, itching, and tingling in an area of the skin. This pain is usually described as burning, stabbing, or throbbing.In a few days or weeks, a painful red rash will appear in the area where the pain, itching, and tingling were felt. The rash is usually on one side of the body in a band or belt-like pattern. Then, the rash usually turns into fluid-filled blisters. They  will scab over and dry up in approximately 2 3 weeks. Flu-like symptoms may also occur with the initial symptoms, the rash, or the blisters. These may include:  Fever.  Chills.  Headache.  Upset stomach. DIAGNOSIS  Your caregiver will perform a skin exam to diagnose shingles. Skin scrapings or fluid samples may also be taken from the blisters. This sample will be examined under a microscope or sent to a lab for further testing. TREATMENT  There is no specific cure for shingles. Your caregiver will likely prescribe medicines to help you manage the pain, recover faster, and avoid long-term problems. This may include antiviral drugs, anti-inflammatory drugs, and pain medicines. HOME CARE INSTRUCTIONS   Take a cool bath or apply cool compresses to the area of the rash or blisters as directed. This may help with the pain and itching.   Only take over-the-counter or prescription medicines as directed by your caregiver.   Rest as directed by your caregiver.  Keep your rash and blisters clean with mild soap and cool water or as directed by your caregiver.  Do not pick your blisters or scratch your rash. Apply an anti-itch cream or numbing creams to the affected area as directed by your caregiver.  Keep your shingles rash covered with a loose bandage (dressing).  Avoid skin contact with:  Babies.   Pregnant women.   Children with eczema.   Elderly people with transplants.   People with chronic illnesses, such as leukemia or AIDS.   Wear loose-fitting clothing to help ease   the pain of material rubbing against the rash.  Keep all follow-up appointments with your caregiver.If the area involved is on your face, you may receive a referral for follow-up to a specialist, such as an eye doctor (ophthalmologist) or an ear, nose, and throat (ENT) doctor. Keeping all follow-up appointments will help you avoid eye complications, chronic pain, or disability.  SEEK IMMEDIATE MEDICAL  CARE IF:   You have facial pain, pain around the eye area, or loss of feeling on one side of your face.  You have ear pain or ringing in your ear.  You have loss of taste.  Your pain is not relieved with prescribed medicines.   Your redness or swelling spreads.   You have more pain and swelling.  Your condition is worsening or has changed.   You have a feveror persistent symptoms for more than 2 3 days.  You have a fever and your symptoms suddenly get worse. MAKE SURE YOU:  Understand these instructions.  Will watch your condition.  Will get help right away if you are not doing well or get worse. Document Released: 03/21/2005 Document Revised: 12/14/2011 Document Reviewed: 11/03/2011 [employer] Patient Information 2014 [employer].  

## 2013-07-25 NOTE — ED Provider Notes (Addendum)
CSN: [id number]     Arrival date & time 07/25/13  0208 History   First MD Initiated Contact with Patient 07/25/13 0217     Chief Complaint  Patient presents with  . Abscess     (Consider location/radiation/quality/duration/timing/severity/associated sxs/prior Treatment) Patient is a 28 y.o. male presenting with abscess. The history is provided by the patient.  Abscess He noted redness and swelling of the distal portion of his right third finger over the last 3 days. It is moderately painful and he rates pain at 7/10. He denies fever or chills. He thinks that he has an abscess and feels that it needs to be drained. He had an injury to the cuticle that finger about 2 weeks ago but denies any trauma since then. Also, he has an itchy area on his back which started yesterday. 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  Past Medical History  Diagnosis Date  . Seizures 1990, 1991    febrile seizures x 3  . Diabetes mellitus 05/1997    type I    Past Surgical History  Procedure Laterality Date  . None    . Tympanostomy tube placement  1990   Family History  Problem Relation Age of Onset  . Heart disease Other    History  Substance Use Topics  . Smoking status: Current Every Day Smoker -- 1.00 packs/day for 10 years    Types: Cigarettes  . Smokeless tobacco: Not on file  . Alcohol Use: Yes     Comment: occasional    Review of Systems  All other systems reviewed and are negative.     Allergies  Codeine and Prednisone  Home Medications   Prior to Admission medications   Medication Sig Start Date End Date Taking? Authorizing Provider  insulin aspart (NOVOLOG) 100 UNIT/ML injection Inject 5-21 Units into the skin 3 (three) times daily before meals. As directed per sliding scale.    Historical Provider, MD  insulin glargine (LANTUS) 100 UNIT/ML injection Inject 38-40 Units into the skin every morning.     Historical  Provider, MD  levofloxacin (LEVAQUIN) 500 MG tablet Take 1 tablet (500 mg total) by mouth daily. 02/19/13   Babs SciaraScott A Luking, MD   BP 120/61  Pulse 66  Temp(Src) 97.8 F (36.6 C) (Oral)  Resp 18  Ht 5\' 7"  (1.702 m)  Wt 155 lb (70.308 kg)  BMI 24.27 kg/m2  SpO2 100% Physical Exam  Nursing note and vitals reviewed.  28 year old male, resting comfortably and in no acute distress. Vital signs are normal. Oxygen saturation is 100%, which is normal. Head is normocephalic and atraumatic. PERRLA, EOMI. Oropharynx is clear. Neck is nontender and supple without adenopathy or JVD. Back is nontender and there is no CVA tenderness. Lungs are clear without rales, wheezes, or rhonchi. Chest is nontender. Heart has regular rate and rhythm without murmur. Abdomen is soft, flat, nontender without masses or hepatosplenomegaly and peristalsis is normoactive. Extremities have no cyanosis or edema, full range of motion is present. Paronychia is present in the right third finger and with overlying erythema and mild fluctuance. No lymphangitic streaks are seen. Skin is warm and dry. Erythematous rash is present in the right scapular area with several early vesicles forming given what appears to be a linear pattern. This is consistent with poison ivy dermatitis. Neurologic: Mental status is normal, cranial nerves are intact, there are no motor or sensory deficits.  ED Course  Procedures (including critical care time) INCISION AND DRAINAGE Performed by: Dione Boozeavid Tongela Encinas Consent: Verbal consent obtained. Risks and benefits: risks, benefits and alternatives were discussed Type: abscess  Body area: Right third finger paronychia   Anesthesia: digital block with 2% Lidocaine  Anesthetic total: 10 ml, additional 5 ml used when anesthesia was found to be inadequate  Incision was made with scissors  Complexity: complex Blunt dissection to break up loculations  Drainage: purulent  Drainage amount:  small  Packing material: none  Patient tolerance: Patient tolerated the procedure well with no immediate complications.    Labs Review Results for orders placed during the hospital encounter of 07/25/13  CBG MONITORING, ED      Result Value Ref Range   Glucose-Capillary 194 (*) 70 - 99 mg/dL   MDM   Final diagnoses:  Paronychia of third finger of right hand  Poison ivy dermatitis    Paronychia of right third finger. Rash on the right scapular area consistent with contact dermatitis from something such as poison ivy. Steroids will not be given for the contact dermatitis because of his diabetes. He is advised to use topical hydrocortisone cream. Paronychias incised and drained but only a small pocket of pus was identified. In spite of infection, and blood sugar is only mildly elevated. He is discharged with prescription for sulfamethoxazole-trimethoprim.    Dione Boozeavid Raeden Belzer, MD 07/25/13 [id number]  Dione Boozeavid Cimberly Stoffel, MD 07/25/13 [phone]

## 2013-07-25 NOTE — Discharge Instructions (Signed)
Paronychia Paronychia is an inflammatory reaction involving the folds of the skin surrounding the fingernail. This is commonly caused by an infection in the skin around a nail. The most common cause of paronychia is frequent wetting of the hands (as seen with bartenders, food servers, nurses or others who wet their hands). This makes the skin around the fingernail susceptible to infection by bacteria (germs) or fungus. Other predisposing factors are:  Aggressive manicuring.  Nail biting.  Thumb sucking. The most common cause is a staphylococcal (a type of germ) infection, or a fungal (Candida) infection. When caused by a germ, it usually comes on suddenly with redness, swelling, pus and is often painful. It may get under the nail and form an abscess (collection of pus), or form an abscess around the nail. If the nail itself is infected with a fungus, the treatment is usually prolonged and may require oral medicine for up to one year. Your caregiver will determine the length of time treatment is required. The paronychia caused by bacteria (germs) may largely be avoided by not pulling on hangnails or picking at cuticles. When the infection occurs at the tips of the finger it is called felon. When the cause of paronychia is from the herpes simplex virus (HSV) it is called herpetic whitlow. TREATMENT  When an abscess is present treatment is often incision and drainage. This means that the abscess must be cut open so the pus can get out. When this is done, the following home care instructions should be followed. HOME CARE INSTRUCTIONS   It is important to keep the affected fingers very dry. Rubber or plastic gloves over cotton gloves should be used whenever the hand must be placed in water.  Keep wound clean, dry and dressed as suggested by your caregiver between warm soaks or warm compresses.  Soak in warm water for fifteen to twenty minutes three to four times per day for bacterial infections. Fungal  infections are very difficult to treat, so often require treatment for long periods of time.  For bacterial (germ) infections take antibiotics (medicine which kill germs) as directed and finish the prescription, even if the problem appears to be solved before the medicine is gone.  Only take over-the-counter or prescription medicines for pain, discomfort, or fever as directed by your caregiver. SEEK IMMEDIATE MEDICAL CARE IF:  You have redness, swelling, or increasing pain in the wound.  You notice pus coming from the wound.  You have a fever.  You notice a bad smell coming from the wound or dressing. Document Released: 09/14/2000 Document Revised: 06/13/2011 Document Reviewed: 05/16/2008 [facility] Patient Information 2014 [place].  Poison [employer] Poison ivy is a inflammation of the skin (contact dermatitis) caused by touching the allergens on the leaves of the ivy plant following previous exposure to the plant. The rash usually appears 48 hours after exposure. The rash is usually bumps (papules) or blisters (vesicles) in a linear pattern. Depending on your own sensitivity, the rash may simply cause redness and itching, or it may also progress to blisters which may break open. These must be well cared for to prevent secondary bacterial (germ) infection, followed by scarring. Keep any open areas dry, clean, dressed, and covered with an antibacterial ointment if needed. The eyes may also get puffy. The puffiness is worst in the morning and gets better as the day progresses. This dermatitis usually heals without scarring, within 2 to 3 weeks without treatment. HOME CARE INSTRUCTIONS  Thoroughly wash with soap and water  as soon as you have been exposed to poison ivy. You have about one half hour to remove the plant resin before it will cause the rash. This washing will destroy the oil or antigen on the skin that is causing, or will cause, the rash. Be sure to wash under your fingernails as any  plant resin there will continue to spread the rash. Do not rub skin vigorously when washing affected area. Poison ivy cannot spread if no oil from the plant remains on your body. A rash that has progressed to weeping sores will not spread the rash unless you have not washed thoroughly. It is also important to wash any clothes you have been wearing as these may carry active allergens. The rash will return if you wear the unwashed clothing, even several days later. Avoidance of the plant in the future is the best measure. Poison ivy plant can be recognized by the number of leaves. Generally, poison ivy has three leaves with flowering branches on a single stem. Diphenhydramine may be purchased over the counter and used as needed for itching. Do not drive with this medication if it makes you drowsy.Ask your caregiver about medication for children. SEEK MEDICAL CARE IF:  Open sores develop.  Redness spreads beyond area of rash.  You notice purulent (pus-like) discharge.  You have increased pain.  Other signs of infection develop (such as fever). Document Released: 03/18/2000 Document Revised: 06/13/2011 Document Reviewed: 02/04/2009 [facility] Patient Information 2014 [place], [place].  Sulfamethoxazole; Trimethoprim, SMX-TMP tablets What is this medicine? SULFAMETHOXAZOLE; TRIMETHOPRIM or SMX-TMP (suhl fuh meth OK suh zohl; trye METH oh prim) is a combination of a sulfonamide antibiotic and a second antibiotic, trimethoprim. It is used to treat or prevent certain kinds of bacterial infections. It will not work for colds, flu, or other viral infections. This medicine may be used for other purposes; ask your health care provider or pharmacist if you have questions. COMMON BRAND NAME(S): Bacter-Aid DS , Bactrim DS, Bactrim, Septra DS, Septra What should I tell my health care provider before I take this medicine? They need to know if you have any of these conditions: -anemia -asthma -being treated  with anticonvulsants -if you frequently drink alcohol containing drinks -kidney disease -liver disease -low level of folic acid or [id number] dehydrogenase -poor nutrition or malabsorption -porphyria -severe allergies -thyroid disorder -an unusual or allergic reaction to sulfamethoxazole, trimethoprim, sulfa drugs, other medicines, foods, dyes, or preservatives -pregnant or trying to get pregnant -breast-feeding How should I use this medicine? Take this medicine by mouth with a full glass of water. Follow the directions on the prescription label. Take your medicine at regular intervals. Do not take it more often than directed. Do not skip doses or stop your medicine early. Talk to your pediatrician regarding the use of this medicine in children. Special care may be needed. This medicine has been used in children as young as 52 months of age. Overdosage: If you think you have taken too much of this medicine contact a poison control center or emergency room at once. NOTE: This medicine is only for you. Do not share this medicine with others. What if I miss a dose? If you miss a dose, take it as soon as you can. If it is almost time for your next dose, take only that dose. Do not take double or extra doses. What may interact with this medicine? Do not take this medicine with any of the following medications: -aminobenzoate potassium -dofetilide -metronidazole This medicine  may also interact with the following medications: -ACE inhibitors like benazepril, enalapril, lisinopril, and ramipril -cyclosporine -digoxin -diuretics -indomethacin -medicines for diabetes -methenamine -methotrexate -phenytoin -potassium supplements -pyrimethamine -sulfinpyrazone -tricyclic antidepressants -warfarin This list may not describe all possible interactions. Give your health care provider a list of all the medicines, herbs, non-prescription drugs, or dietary supplements you use. Also tell  them if you smoke, drink alcohol, or use illegal drugs. Some items may interact with your medicine. What should I watch for while using this medicine? Tell your doctor or health care professional if your symptoms do not improve. Drink several glasses of water a day to reduce the risk of kidney problems. Do not treat diarrhea with over the counter products. Contact your doctor if you have diarrhea that lasts more than 2 days or if it is severe and watery. This medicine can make you more sensitive to the sun. Keep out of the sun. If you cannot avoid being in the sun, wear protective clothing and use a sunscreen. Do not use sun lamps or tanning beds/booths. What side effects may I notice from receiving this medicine? Side effects that you should report to your doctor or health care professional as soon as possible: -allergic reactions like skin rash or hives, swelling of the face, lips, or tongue -breathing problems -fever or chills, sore throat -irregular heartbeat, chest pain -joint or muscle pain -pain or difficulty passing urine -red pinpoint spots on skin -redness, blistering, peeling or loosening of the skin, including inside the mouth -unusual bleeding or bruising -unusually weak or tired -yellowing of the eyes or skin Side effects that usually do not require medical attention (report to your doctor or health care professional if they continue or are bothersome): -diarrhea -dizziness -headache -loss of appetite -nausea, vomiting -nervousness This list may not describe all possible side effects. Call your doctor for medical advice about side effects. You may report side effects to FDA at 1-800-FDA-1088. Where should I keep my medicine? Keep out of the reach of children. Store at room temperature between 20 to 25 degrees C (68 to 77 degrees F). Protect from light. Throw away any unused medicine after the expiration date. NOTE: This sheet is a summary. It may not cover all possible  information. If you have questions about this medicine, talk to your doctor, pharmacist, or health care provider.  2014, Elsevier/Gold Standard. (2007-11-21 11:32:51)

## 2013-07-25 NOTE — ED Notes (Signed)
Discharge instructions and prescriptions given and reviewed with patient.  Patient verbalized understanding to complete all antibiotic.  Patient ambulatory; discharged home in good condition.

## 2013-07-28 ENCOUNTER — Encounter: Payer: Self-pay | Admitting: Nurse Practitioner

## 2013-07-28 DIAGNOSIS — L03019 Cellulitis of unspecified finger: Secondary | ICD-10-CM | POA: Insufficient documentation

## 2013-07-28 DIAGNOSIS — B029 Zoster without complications: Secondary | ICD-10-CM | POA: Insufficient documentation

## 2013-07-28 NOTE — Progress Notes (Signed)
Subjective:  Presents after being seen in ED early this morning for paronychia of his right finger. Started on antibiotics. Has a rash on his left upper back area that is been there since Monday morning. Nontender. Mildly pruritic. No known contacts. Was told to ER that is poison ivy rash. No fever. Has type 1 diabetes, sugars are minimally changed according to patient. Has not picked up his antibiotics.  Objective:   BP 112/72  Temp(Src) 98.3 F (36.8 C) (Oral)  Ht 5\' 7"  (1.702 m)  Wt 147 lb 4 oz (66.792 kg)  BMI 23.06 kg/m2 NAD. Alert, oriented. Band-Aid removed from the right middle finger, infection noted around the nailbed. A cluster of discrete slightly raised pink lesions noted in the upper back left side, nontender to palpation. No other rash is noted.  Assessment: Paronychia of finger  Herpes zoster  Plan: Meds ordered this encounter  Medications  . valACYclovir (VALTREX) 1000 MG tablet    Sig: Take 1 tablet (1,000 mg total) by mouth 3 (three) times daily.    Dispense:  21 tablet    Refill:  0    Order Specific Question:  Supervising Provider    Answer:  Merlyn AlbertLUKING, WILLIAM S [[id number]]   Because of patient's history of diabetes, will treat with Valtrex per his request. Patient to pick up antibiotics as soon as possible and start them this morning. Complete antibiotics as directed. Call back next week if no improvement, call or go to ED sooner if worse.

## 2016-03-04 MED FILL — NovoLOG 100 UNIT/ML SOLN: 100 | 30 days supply | Qty: 40 | Fill #0 | Status: TO

## 2016-03-07 ENCOUNTER — Ambulatory Visit: Payer: Self-pay | Admitting: *Deleted

## 2016-03-07 ENCOUNTER — Other Ambulatory Visit: Payer: Self-pay | Admitting: *Deleted

## 2016-03-07 VITALS — BP 110/82 | Ht 67.0 in | Wt 175.0 lb

## 2016-03-07 DIAGNOSIS — E109 Type 1 diabetes mellitus without complications: Secondary | ICD-10-CM

## 2016-03-07 LAB — POCT CBG (FASTING - GLUCOSE)-MANUAL ENTRY: Glucose Fasting, POC: 200 mg/dL — AB (ref 70–99)

## 2016-03-07 LAB — POCT GLYCOSYLATED HEMOGLOBIN (HGB A1C): Hemoglobin A1C: 9.3

## 2016-03-07 NOTE — Patient Outreach (Addendum)
[facility]) Care Management   03/07/2016  Michel SanteeBrandon P Dials 31-Jan-1986 [id number]  Michel SanteeBrandon P Masters is an 30 y.o. male who presents to the [facility] Care Management office with his wife [name] to enroll in the Link To Wellness program for self management assistance with Type I DM.  [name] was previously in the program from 04/18/12 until he turned 28 on 11/23/2013 and lost his insurance with [facility] as a dependent on his Mom's health plan.   Subjective: [name] states his blood sugar control has not been as good since he stopped using the Dexcom continuous glucose monitor due to financial issues and with a recent change in jobs that has kept him from checking his blood sugars as frequently. He says his blood sugars vary greatly, and reports his fasting blood sugar this morning as 240 with a pre-lunch value of 61. He says he has fairly frequent lows and can tolerate severe lows such as 27 and can self treat. He states he has never required assistance to treat a low. He says he uses an Omnipod for continuous insulin delivery and really likes it because it has no tubing and his job is very physical and he is concerned if he changes to a pump with tubes he will be dislodging the infusion port often. He denies any recent emergency room or hospital admissions.  His wife [name] is a Engineer, civil (consulting)nurse with [employer].   Objective:   Review of Systems  Constitutional: Negative.     Physical Exam  Constitutional: He is oriented to person, place, and time. He appears well-developed and well-nourished.  Respiratory: Effort normal.  Neurological: He is alert and oriented to person, place, and time.  Skin: Skin is warm and dry.  Psychiatric: He has a normal mood and affect. His behavior is normal. Judgment and thought content normal.    Encounter Medications:   Outpatient Encounter Prescriptions as of 03/07/2016  Medication Sig  . insulin aspart (NOVOLOG) 100 UNIT/ML  injection Continuous via Omnipod Insulin Pump   No facility-administered encounter medications on file as of 03/07/2016.     Functional Status:   In your present state of health, do you have any difficulty performing the following activities: 03/07/2016  Hearing? N  Vision? N  Difficulty concentrating or making decisions? N  Walking or climbing stairs? N  Dressing or bathing? N  Doing errands, shopping? N  Some recent data might be hidden    Fall/Depression Screening:    PHQ 2/9 Scores 03/07/2016  PHQ - 2 Score 0    Assessment:  Spouse of [employer] employee enrolling in the Link To Wellness program for self management assistance with Type I DM, currently not meeting treatment Hgb A1C as evidenced by POC Hgb A1C= 9.3%.   Plan:  [facility] CM Care Plan Problem One   Flowsheet Row Most Recent Value  Care Plan Problem One Type I DM currently not meeting Hgb A1C goal as evidenced by POC Hgb A1C= 9.3% and significant blood sugar variability per patient report  Role Documenting the Problem One  Care Management Coordinator  Care Plan for Problem One  Active  [facility] Long Term Goal (31-90 days) Improved glycemic control as evidenced by improved Hgb A1C at next assessment with decreased variability and without increased hypoglycemia   [facility] Long Term Goal Start Date  03/07/16  Interventions for Problem One Long Term Goal Discussed Link to [employer] program goals, requirements and benefits, reviewed member's rights and responsibilities,  provided diabetes information packet with explanation of contents, ensured member agreed and signed consent to participate and authorization to release and receive health information, consent, participation agreement and consent to enroll in program, assessed member's current knowledge of Type I diabetes, discussed current Insulin regimen, reviewed blood sugar monitoring and values, discussed changes to the [facility] pharmacy formulary that will impact [name], discussed the  Medtronic 670 G hybrid closed loop insulin delivery system and the benefit coverage (100% as long as [name] remains active in the [employer] program), showed video of the system to ConcreteBrandon and ComptcheAmanda and answered their questions, encouraged [name] to discuss this system and request change to Humalog ( so he will not have a copay) with Dr. Evlyn [facility]anner[facility] when he sees him in January, advised [name] to call [employer] to see what the copay will be if [name] chooses to stay with the Omnipod since it will not be covered at 100% beginning in 2018,  will arrange for Link To Wellness follow up in May 2018 or sooner if needed     Will fax today's note to Dr. Evlyn KannerSouth.  Bary RichardJanet S. Hauser RN,CCM,CDE [facility] Care Management Coordinator Link To Wellness Office Phone [phone] Office Fax [phone]

## 2016-03-10 ENCOUNTER — Encounter: Payer: Self-pay | Admitting: *Deleted

## 2016-03-18 ENCOUNTER — Other Ambulatory Visit: Payer: Self-pay | Admitting: *Deleted

## 2016-03-18 NOTE — Patient Outreach (Signed)
Returned call to [name], she states she received a letter from [facility] Human Resources stating that in 2018 neither she or Apolinar JunesBrandon will be  eligible for [facility] insurance because of her per diem work status. She is asking for directions on how she may secure Dexcom continuous glucose sensors before year's end as Apolinar JunesBrandon  Is currently covered under the health plan until the end of 2017. Advised her to call [employer] to secure the inforamion she will need in order for Dr. Evlyn KannerSouth to supply Apolinar JunesBrandon with a prescription for sensor, etc.  Bary RichardJanet S. Hauser RN,CCM,CDE [facility] Care Management Coordinator Link To Wellness Office Phone [phone] Office Fax [phone]

## 2016-03-24 DIAGNOSIS — E1065 Type 1 diabetes mellitus with hyperglycemia: Secondary | ICD-10-CM | POA: Diagnosis not present

## 2016-03-24 DIAGNOSIS — E1040 Type 1 diabetes mellitus with diabetic neuropathy, unspecified: Secondary | ICD-10-CM | POA: Diagnosis not present

## 2016-03-24 DIAGNOSIS — E109 Type 1 diabetes mellitus without complications: Secondary | ICD-10-CM | POA: Diagnosis not present

## 2016-04-05 ENCOUNTER — Other Ambulatory Visit: Payer: Self-pay | Admitting: *Deleted

## 2016-04-05 NOTE — Patient Outreach (Signed)
[name] is no longer eligible for the link To Wellness program because he no longer has the [facility] Plan under his wife's coverage effective 12/31/7. Therefore, case being closed to Link To Wellness diabetes program. Bary RichardJanet S. Almadelia Looman RN,CCM,CDE [facility] Care Management Coordinator Link To Wellness Office Phone [phone] Office Fax [phone]

## 2017-01-23 ENCOUNTER — Encounter: Payer: Self-pay | Admitting: *Deleted

## 2017-01-23 ENCOUNTER — Other Ambulatory Visit: Payer: Self-pay | Admitting: *Deleted

## 2017-01-23 NOTE — Patient Outreach (Signed)
[facility]) Care Management   01/23/2017  Jacob SanteeBrandon P Carrigg 1986/02/22 [id number]  [name] is an 31 y.o. male who presents to the [facility] Care Management office to re- enroll in the Link To Wellness program for self management assistance with Type I DM. 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  [name] was previously in the program from 04/18/12 until he turned 28 on 11/23/2013 and then again from 03/07/16 until 04/05/16 when he changed to his own company's insurance.   Subjective: [name] states his blood sugar control has not been as good since he stopped using the Dexcom continuous glucose monitor due to difficulty getting the sensors via [employer].  He reports his fasting blood sugar this morning as 140. He  He states he has never required assistance to treat a low. He says he uses an Omnipod for continuous insulin delivery and really likes it because it has no tubing and his job is very physical and he is concerned if he changes to a pump with tubes he will be dislodging the infusion port often. He denies any recent emergency room or hospital admissions.  His wife [name] is a Engineer, civil (consulting)nurse in the flex pool with [employer] and she works 7a-7p.  [name] is asking for a POC Hgb A1C today.    Objective:   Review of Systems  Constitutional: Negative.     Physical Exam  Constitutional: He is oriented to person, place, and time. He appears well-developed and well-nourished.  Respiratory: Effort normal.  Neurological: He is alert and oriented to person, place, and time.  Skin: Skin is warm and dry.  Psychiatric: He has a normal mood and affect. His behavior is normal. Judgment and thought content normal.   Vitals:   01/23/17 1119  Weight: 188 lb (85.3 kg)  Height: 1.702 m (5\' 7" )    POC post prandial CBG= 240 - breakfast of cold cereal with milk with 70 units of coverage pre-meal POC Hgb A1C=8.2% (eAG= 189)  Outpatient  Encounter Prescriptions as of 01/23/2017  Medication Sig  . insulin lispro (HUMALOG) 100 UNIT/ML injection For use in Omnipod insulin pump  . levothyroxine (SYNTHROID, LEVOTHROID) 100 MCG tablet Take 100 mcg by mouth daily before breakfast.   No facility-administered encounter medications on file as of 01/23/2017.      Functional Status:   In your present state of health, do you have any difficulty performing the following activities: 01/23/2017 03/07/2016  Hearing? N N  Vision? N N  Difficulty concentrating or making decisions? N N  Walking or climbing stairs? N N  Dressing or bathing? N N  Doing errands, shopping? N N  Preparing Food and eating ? N -  Using the Toilet? N -  In the past six months, have you accidently leaked urine? N -  Do you have problems with loss of bowel control? N -  Managing your Medications? N -  Managing your Finances? N -  Housekeeping or managing your Housekeeping? N -  Some recent data might be hidden    Fall/Depression Screening:    PHQ 2/9 Scores 01/23/2017 03/07/2016  PHQ - 2 Score 0 0    Assessment:  Spouse of [employer] employee enrolling in the Link To Wellness program for self management assistance with Type I DM, currently not meeting treatment Hgb A1C as evidenced by POC Hgb A1C=8.2%.   Plan:  [facility] CM Care [facility] Problem One   Flowsheet Row Most  Recent Value  Care Plan Problem One Type I DM currently not meeting Hgb A1C goal as evidenced by POC Hgb A1C= 8.2%, re-enrolling in the Link To Wellness program as he needs the program's pharmacy benefits of testing supplies and pump and pump supplies, and insulin  Role Documenting the Problem One  Care Management Coordinator  Care Plan for Problem One  Active  [facility] Long Term Goal (31-90 days) Improved glycemic control as evidenced by improved Hgb A1C at next assessment with decreased variability and without increased hypoglycemia, Logyn will discuss 670 G insulin pump with Dr. Evlyn Kanner when he sees him  on 01/30/17  [facility] Long Term Goal Start Date 01/30/17  Interventions for Problem One Long Term Goal Discussed Link to [employer], requirements and benefits, reviewed member's rights and responsibilities, ensured member agreed and signed consent to participate and authorization to release and receive health information, consent, participation agreement and consent to enroll in program, discussed current insulin regimen, reviewed blood sugar monitoring and values, assessed POC CBG and POC Hgb A1C and discussed results and reviewed targets,  discussed changes to the [facility] pharmacy formulary that will impact [name], discussed the Medtronic 670 G hybrid closed loop insulin delivery system and the benefit coverage (100% as long as [name] remains active in the [employer]), showed a video of the system to Clementon and  answered his questions and provided written information, encouraged [name] to discuss this system with Dr. Evlyn Kanner when he sees him on 10/29,  will arrange for Link To Wellness follow up in January 2019 or sooner if needed     Will fax today's note to Dr. Evlyn Kanner. Will meet with patient every 3 months and as needed to assist with Type I DM self management.  Bary Richard RN,CCM,CDE [facility] Care Management Coordinator Link To Wellness Office Phone [phone] Office Fax [phone]

## 2017-01-30 DIAGNOSIS — F418 Other specified anxiety disorders: Secondary | ICD-10-CM | POA: Diagnosis not present

## 2017-01-30 DIAGNOSIS — E1142 Type 2 diabetes mellitus with diabetic polyneuropathy: Secondary | ICD-10-CM | POA: Diagnosis not present

## 2017-01-30 DIAGNOSIS — E1040 Type 1 diabetes mellitus with diabetic neuropathy, unspecified: Secondary | ICD-10-CM | POA: Diagnosis not present

## 2017-01-30 DIAGNOSIS — E038 Other specified hypothyroidism: Secondary | ICD-10-CM | POA: Diagnosis not present

## 2017-01-30 DIAGNOSIS — E7849 Other hyperlipidemia: Secondary | ICD-10-CM | POA: Diagnosis not present

## 2017-01-30 DIAGNOSIS — Z6829 Body mass index (BMI) 29.0-29.9, adult: Secondary | ICD-10-CM | POA: Diagnosis not present

## 2017-02-06 MED FILL — HumaLOG 100 UNIT/ML SOLN: 100 | 30 days supply | Qty: 40 | Fill #0

## 2017-02-22 DIAGNOSIS — H52203 Unspecified astigmatism, bilateral: Secondary | ICD-10-CM | POA: Diagnosis not present

## 2017-02-22 DIAGNOSIS — E119 Type 2 diabetes mellitus without complications: Secondary | ICD-10-CM | POA: Diagnosis not present

## 2017-02-22 DIAGNOSIS — H5203 Hypermetropia, bilateral: Secondary | ICD-10-CM | POA: Diagnosis not present

## 2017-02-28 ENCOUNTER — Other Ambulatory Visit: Payer: Self-pay | Admitting: *Deleted

## 2017-02-28 NOTE — Patient Outreach (Signed)
Spoke to [name] via phone advising him  that disease self-management services will be transitioned from the [facility] program to [facility] in 2019. Also advised him that a letter will be mailed to the home residence with details of this transition. Will close case to [facility] diabetes program. [name] RN,CCM,CDE [facility] Care Management Coordinator [facility] Office Phone [phone] Office Fax [phone]

## 2017-03-24 MED FILL — HumaLOG 100 UNIT/ML SOLN: 100 | 30 days supply | Qty: 30 | Fill #0

## 2017-04-04 DIAGNOSIS — R0789 Other chest pain: Secondary | ICD-10-CM

## 2017-04-04 HISTORY — DX: Other chest pain: R07.89

## 2017-04-06 MED FILL — LEVOTHYROXINE 100 MCG TABLE: 100 | 30 days supply | Qty: 30 | Fill #0

## 2017-04-14 MED FILL — OMEPRAZOLE DR 40 MG CAPSULE: 40 | 30 days supply | Qty: 30 | Fill #0

## 2017-04-17 ENCOUNTER — Telehealth: Payer: Self-pay

## 2017-04-17 NOTE — Telephone Encounter (Signed)
Referral sent to scheduling. Records placed in top drawer in Chart Prep

## 2017-04-21 NOTE — Progress Notes (Signed)
Referring-Lindsay Marguerita Beards NP Reason for referral-Chest pain  HPI: 32 yo male for evaluation of chest pain at request of Geoffery Lyons NP.  Laboratories from January 2019 reviewed.  BUN 13 and creatinine 1.0.  Potassium 4.4.  Hemoglobin 15.4.  Chest x-ray with no active disease.  Patient describes intermittent chest pain over the past 1-1/2 months.  It is substernal and described as a stabbing sensation.  Occasionally radiates to his left upper extremity.  There is mild diaphoresis and dyspnea but no nausea.  The pain typically occurs with stress and can last for hours at a time.  It is not pleuritic, positional, exertional or related to food.  He also has some dyspnea on exertion but no orthopnea, PND or pedal edema.  Because of the above we were asked to evaluate.  Current Outpatient Medications  Medication Sig Dispense Refill  . Insulin Disposable Pump (OMNIPOD DASH SYSTEM) KIT by Does not apply route.    . insulin glargine (LANTUS) 100 UNIT/ML injection Inject 38-40 Units into the skin every morning.     Marland Kitchen levothyroxine (SYNTHROID, LEVOTHROID) 100 MCG tablet Take 100 mcg by mouth daily before breakfast.    . omeprazole (PRILOSEC) 40 MG capsule Take 40 mg by mouth daily.  2   No current facility-administered medications for this visit.     Allergies  Allergen Reactions  . Codeine   . Prednisone     Extreme hyperglycemia     Past Medical History:  Diagnosis Date  . Atypical chest pain 04/2017   w/SOB. Pain radiates down left arm at times. Fam Hx of heart disease  . CTS (carpal tunnel syndrome) 2009   (R)  . Diabetes mellitus 05/1997   type I   . DKA (diabetic ketoacidoses) ([place]) 2003 & 2004  . GERD (gastroesophageal reflux disease)   . Hypothyroidism   . Pilonidal cyst 2008  . Seizure ([place]) 2002   hypoglycemic  . Seizures ([place]) 1990, 1991   febrile seizures x 3    Past Surgical History:  Procedure Laterality Date  . none    . TYMPANOSTOMY TUBE PLACEMENT  1990     Social History   Socioeconomic History  . Marital status: Married    Spouse name: Not on file  . Number of children: 1  . Years of education: Not on file  . Highest education level: Not on file  Social Needs  . Financial resource strain: Not on file  . Food insecurity - worry: Not on file  . Food insecurity - inability: Not on file  . Transportation needs - medical: Not on file  . Transportation needs - non-medical: Not on file  Occupational History  . Not on file  Tobacco Use  . Smoking status: Former Smoker    Packs/day: 1.00    Years: 10.00    Pack years: 10.00    Types: Cigarettes  . Smokeless tobacco: Never Used  Substance and Sexual Activity  . Alcohol use: Yes    Comment: occasional  . Drug use: No  . Sexual activity: Not on file  Other Topics Concern  . Not on file  Social History Narrative  . Not on file    Family History  Problem Relation Age of Onset  . Heart disease Other   . Hypertension Maternal Grandfather   . High Cholesterol Maternal Grandfather   . Hypertension Paternal Grandfather   . High Cholesterol Paternal Grandfather     ROS: no fevers or chills, productive  cough, hemoptysis, dysphasia, odynophagia, melena, hematochezia, dysuria, hematuria, rash, seizure activity, orthopnea, PND, pedal edema, claudication. Remaining systems are negative.  Physical Exam:   Blood pressure 111/70, pulse 72, height 5' 7"  (1.702 m), weight 186 lb 12.8 oz (84.7 kg), SpO2 100 %.  General:  Well developed/well nourished in NAD Skin warm/dry Patient not depressed No peripheral clubbing Back-normal HEENT-normal/normal eyelids Neck supple/normal carotid upstroke bilaterally; no bruits; no JVD; no thyromegaly chest - CTA/ normal expansion CV - RRR/normal S1 and S2; no murmurs, rubs or gallops;  PMI nondisplaced Abdomen -NT/ND, no HSM, no mass, + bowel sounds, no bruit 2+ femoral pulses, no bruits Ext-no edema, chords, 2+ DP Neuro-grossly nonfocal  ECG  -Apr 14, 2017-normal sinus rhythm with no ST changes.  Personally reviewed  A/P  1 chest pain-symptoms are atypical.  Electrocardiogram is normal.  Given diabetes mellitus we will arrange a stress nuclear study for risk stratification.  I will also arrange an echocardiogram to assess LV function given dyspnea.  2 dyspnea-as above we will arrange an echocardiogram to assess LV function.  3 diabetes mellitus-management per primary care.  Note given long history of diabetes mellitus it would likely be worthwhile to begin a statin long-term.  He will discuss this with Dr. Forde Dandy at next office visit.  Kirk Ruths, MD

## 2017-04-28 ENCOUNTER — Ambulatory Visit: Payer: 59 | Admitting: Cardiovascular Disease

## 2017-04-28 ENCOUNTER — Ambulatory Visit: Payer: No Typology Code available for payment source | Admitting: Cardiology

## 2017-04-28 ENCOUNTER — Encounter: Payer: Self-pay | Admitting: Cardiology

## 2017-04-28 VITALS — BP 111/70 | HR 72 | Ht 67.0 in | Wt 186.8 lb

## 2017-04-28 DIAGNOSIS — R072 Precordial pain: Secondary | ICD-10-CM

## 2017-04-28 DIAGNOSIS — R0600 Dyspnea, unspecified: Secondary | ICD-10-CM

## 2017-04-28 NOTE — Patient Instructions (Signed)
Medication Instructions:   NO CHANGE  Testing/Procedures:  Your physician has requested that you have an echocardiogram. Echocardiography is a painless test that uses sound waves to create images of your heart. It provides your doctor with information about the size and shape of your heart and how well your heart's chambers and valves are working. This procedure takes approximately one hour. There are no restrictions for this procedure.   Your physician has requested that you have en exercise stress myoview. For further information please visit [email or web address]. Please follow instruction sheet, as given.    Follow-Up:  Your physician wants you to follow-up in: ONE YEAR WITH DR Shelda PalRENSHAW You will receive a reminder letter in the mail two months in advance. If you don't receive a letter, please call our office to schedule the follow-up appointment.   If you need a refill on your cardiac medications before your next appointment, please call your pharmacy.

## 2017-05-04 ENCOUNTER — Telehealth (HOSPITAL_COMMUNITY): Payer: Self-pay | Admitting: *Deleted

## 2017-05-04 MED FILL — HumaLOG 100 UNIT/ML SOLN: 100 | 30 days supply | Qty: 30 | Fill #0

## 2017-05-04 NOTE — Telephone Encounter (Signed)
Patient given detailed instructions per Myocardial Perfusion Study Information Sheet for the test on 05/09/17. Patient notified to arrive 15 minutes early and that it is imperative to arrive on time for appointment to keep from having the test rescheduled.  If you need to cancel or reschedule your appointment, please call the office within 24 hours of your appointment. . Patient verbalized understanding. Korie Brabson Jacqueline    

## 2017-05-08 MED FILL — LEVOTHYROXINE 100 MCG TABLE: 100 | 30 days supply | Qty: 30 | Fill #1

## 2017-05-09 ENCOUNTER — Other Ambulatory Visit: Payer: Self-pay

## 2017-05-09 ENCOUNTER — Ambulatory Visit (HOSPITAL_BASED_OUTPATIENT_CLINIC_OR_DEPARTMENT_OTHER): Payer: No Typology Code available for payment source

## 2017-05-09 ENCOUNTER — Ambulatory Visit (HOSPITAL_COMMUNITY): Payer: No Typology Code available for payment source | Attending: Cardiovascular Disease

## 2017-05-09 DIAGNOSIS — R072 Precordial pain: Secondary | ICD-10-CM | POA: Diagnosis present

## 2017-05-09 LAB — MYOCARDIAL PERFUSION IMAGING
CHL CUP NUCLEAR SRS: 0
CHL CUP NUCLEAR SSS: 2
CHL CUP RESTING HR STRESS: 84 {beats}/min
CSEPEDS: 31 s
CSEPEW: 12.5 METS
Exercise duration (min): 10 min
LV dias vol: 102 mL (ref 62–150)
LVSYSVOL: 57 mL
MPHR: 191 {beats}/min
Peak HR: 179 {beats}/min
Percent HR: 94 %
RATE: 0.29
RPE: 19
SDS: 2
TID: 0.96

## 2017-05-09 LAB — ECHOCARDIOGRAM COMPLETE
HEIGHTINCHES: 67 in
Weight: 2976 oz

## 2017-05-09 MED ORDER — TECHNETIUM TC 99M SESTAMIBI GENERIC - CARDIOLITE
11.0000 | Freq: Once | INTRAVENOUS | Status: AC | PRN
  Administered 2017-05-09: 11 via INTRAVENOUS
  Filled 2017-05-09: qty 11

## 2017-05-09 MED ORDER — TECHNETIUM TC 99M TETROFOSMIN IV KIT
30.7000 | PACK | Freq: Once | INTRAVENOUS | Status: AC | PRN
  Administered 2017-05-09: 30.7 via INTRAVENOUS
  Filled 2017-05-09: qty 31

## 2017-05-19 MED FILL — OMEPRAZOLE DR 40 MG CAPSULE: 40 | 30 days supply | Qty: 30 | Fill #1

## 2017-06-01 MED FILL — DULoxetine HCL 30 MG CPEP: 30 | 30 days supply | Qty: 30 | Fill #0

## 2017-06-12 MED FILL — LEVOTHYROXINE 100 MCG TABLE: 100 | 90 days supply | Qty: 90 | Fill #0

## 2017-06-13 MED FILL — CONTOUR NEXT STRIPS: 30 days supply | Qty: 200 | Fill #0

## 2017-06-19 MED FILL — OMEPRAZOLE DR 40 MG CAPSULE: 40 | 30 days supply | Qty: 30 | Fill #2

## 2017-06-29 MED FILL — DULoxetine HCL 30 MG CPEP: 30 | 30 days supply | Qty: 30 | Fill #1

## 2017-07-10 MED FILL — HumaLOG 100 UNIT/ML SOLN: 100 | 30 days supply | Qty: 30 | Fill #1

## 2017-07-24 MED FILL — OMEPRAZOLE DR 40 MG CAPSULE: 40 | 30 days supply | Qty: 30 | Fill #0

## 2017-08-02 MED FILL — DULoxetine HCL 30 MG CPEP: 30 | 30 days supply | Qty: 30 | Fill #2

## 2017-08-17 MED FILL — HumaLOG 100 UNIT/ML SOLN: 100 | 30 days supply | Qty: 30 | Fill #1

## 2017-08-24 MED FILL — DULoxetine HCL 30 MG CPEP: 30 | 30 days supply | Qty: 30 | Fill #3

## 2017-08-24 MED FILL — OMEPRAZOLE DR 40 MG CAPSULE: 40 | 30 days supply | Qty: 30 | Fill #1

## 2017-09-12 MED FILL — HumaLOG 100 UNIT/ML SOLN: 100 | 30 days supply | Qty: 30 | Fill #0

## 2017-09-12 MED FILL — LEVOTHYROXINE 100 MCG TABLE: 100 | 90 days supply | Qty: 90 | Fill #1

## 2017-09-26 MED FILL — OMEPRAZOLE DR 40 MG CAPSULE: 40 | 30 days supply | Qty: 30 | Fill #2

## 2017-10-09 MED FILL — DULoxetine HCL 30 MG CPEP: 30 | 30 days supply | Qty: 30 | Fill #4

## 2017-10-17 MED FILL — HumaLOG 100 UNIT/ML SOLN: 100 | 30 days supply | Qty: 30 | Fill #1

## 2017-11-01 MED FILL — OMEPRAZOLE 40 MG CPDR: 40 | 30 days supply | Qty: 30 | Fill #0

## 2017-11-02 MED FILL — DULoxetine HCL 30 MG CPEP: 30 | 30 days supply | Qty: 30 | Fill #5

## 2017-11-28 MED FILL — HumaLOG 100 UNIT/ML SOLN: 100 | 30 days supply | Qty: 30 | Fill #0

## 2017-12-05 MED FILL — OMEPRAZOLE 40 MG CPDR: 40 | 30 days supply | Qty: 30 | Fill #1

## 2017-12-21 MED FILL — LEVOTHYROXINE 100 MCG TABLE: 100 | 90 days supply | Qty: 90 | Fill #2

## 2018-01-01 MED FILL — OMEPRAZOLE 40 MG CPDR: 40 | 30 days supply | Qty: 30 | Fill #2

## 2018-01-01 MED FILL — DULoxetine HCL 30 MG CPEP: 30 | 30 days supply | Qty: 30 | Fill #0

## 2018-01-01 MED FILL — HumaLOG 100 UNIT/ML SOLN: 100 | 30 days supply | Qty: 30 | Fill #1

## 2018-02-07 MED FILL — OMEPRAZOLE 40 MG CPDR: 40 | 30 days supply | Qty: 30 | Fill #3

## 2018-02-08 MED FILL — HumaLOG 100 UNIT/ML SOLN: 100 | 10 days supply | Qty: 10 | Fill #0

## 2018-02-15 MED FILL — HumaLOG 100 UNIT/ML SOLN: 100 | 40 days supply | Qty: 40 | Fill #0

## 2018-02-15 MED FILL — DULoxetine HCL 30 MG CPEP: 30 | 30 days supply | Qty: 30 | Fill #1

## 2018-03-16 MED FILL — LEVOTHYROXINE 100 MCG TABLE: 100 | 90 days supply | Qty: 90 | Fill #0

## 2018-03-19 MED FILL — OMEPRAZOLE 40 MG CPDR: 40 | 30 days supply | Qty: 30 | Fill #4

## 2018-03-19 MED FILL — DULoxetine HCL 30 MG CPEP: 30 | 30 days supply | Qty: 30 | Fill #0

## 2018-03-22 MED FILL — HumaLOG 100 UNIT/ML SOLN: 100 | 40 days supply | Qty: 40 | Fill #1

## 2018-04-12 ENCOUNTER — Encounter: Payer: Self-pay | Admitting: Internal Medicine

## 2018-04-12 DIAGNOSIS — F418 Other specified anxiety disorders: Secondary | ICD-10-CM | POA: Diagnosis not present

## 2018-04-12 DIAGNOSIS — Z794 Long term (current) use of insulin: Secondary | ICD-10-CM | POA: Diagnosis not present

## 2018-04-12 DIAGNOSIS — E1042 Type 1 diabetes mellitus with diabetic polyneuropathy: Secondary | ICD-10-CM | POA: Diagnosis not present

## 2018-04-12 DIAGNOSIS — F329 Major depressive disorder, single episode, unspecified: Secondary | ICD-10-CM | POA: Diagnosis not present

## 2018-04-12 DIAGNOSIS — E1040 Type 1 diabetes mellitus with diabetic neuropathy, unspecified: Secondary | ICD-10-CM | POA: Diagnosis not present

## 2018-04-12 DIAGNOSIS — Z6828 Body mass index (BMI) 28.0-28.9, adult: Secondary | ICD-10-CM | POA: Diagnosis not present

## 2018-04-12 DIAGNOSIS — E038 Other specified hypothyroidism: Secondary | ICD-10-CM | POA: Diagnosis not present

## 2018-04-12 DIAGNOSIS — E7849 Other hyperlipidemia: Secondary | ICD-10-CM | POA: Diagnosis not present

## 2018-04-12 DIAGNOSIS — K219 Gastro-esophageal reflux disease without esophagitis: Secondary | ICD-10-CM | POA: Diagnosis not present

## 2018-04-12 MED FILL — DULoxetine HCL 60 MG CPEP: 60 | 90 days supply | Qty: 90 | Fill #0

## 2018-04-13 MED FILL — AZITHROMYCIN 250 MG TABLET: 250 | 5 days supply | Qty: 6 | Fill #0

## 2018-04-16 ENCOUNTER — Ambulatory Visit: Payer: No Typology Code available for payment source | Admitting: Gastroenterology

## 2018-04-23 MED FILL — OMEPRAZOLE 40 MG CPDR: 40 | 30 days supply | Qty: 30 | Fill #5

## 2018-04-23 MED FILL — HumaLOG 100 UNIT/ML SOLN: 100 | 40 days supply | Qty: 40 | Fill #2

## 2018-04-28 ENCOUNTER — Encounter: Payer: Self-pay | Admitting: Family Medicine

## 2018-04-28 ENCOUNTER — Ambulatory Visit: Payer: Self-pay | Admitting: Family Medicine

## 2018-04-28 ENCOUNTER — Other Ambulatory Visit: Payer: Self-pay

## 2018-04-28 ENCOUNTER — Ambulatory Visit
Admission: EM | Admit: 2018-04-28 | Discharge: 2018-04-28 | Disposition: A | Discharge status: Home / Self Care | Payer: 59 | Attending: Family Medicine | Admitting: Family Medicine

## 2018-04-28 ENCOUNTER — Ambulatory Visit: Payer: 59

## 2018-04-28 DIAGNOSIS — R079 Chest pain, unspecified: Secondary | ICD-10-CM | POA: Diagnosis not present

## 2018-04-28 DIAGNOSIS — R05 Cough: Secondary | ICD-10-CM | POA: Diagnosis not present

## 2018-04-28 DIAGNOSIS — J0111 Acute recurrent frontal sinusitis: Secondary | ICD-10-CM | POA: Insufficient documentation

## 2018-04-28 MED ORDER — ALBUTEROL SULFATE HFA 108 (90 BASE) MCG/ACT IN AERS: 1.0000 | INHALATION_SPRAY | Freq: Four times a day (QID) | RESPIRATORY_TRACT | 0 refills | Status: DC | PRN

## 2018-04-28 MED ORDER — AMOXICILLIN-POT CLAVULANATE 875-125 MG PO TABS: 1.0000 | ORAL_TABLET | Freq: Two times a day (BID) | ORAL | 0 refills | Status: DC

## 2018-04-28 MED ORDER — PREDNISONE 10 MG PO TABS: 20.0000 mg | ORAL_TABLET | Freq: Every day | ORAL | 0 refills | Status: AC

## 2018-04-28 NOTE — Discharge Instructions (Signed)
We will go ahead and treat you for a sinus infection and the inflammation in your lungs Augmentin twice a day for 7 days for the infection Prednisone daily for the next 5 days for inflammation Albuterol inhaler to use as needed for cough, wheezing, shortness of breath Tylenol/ibuprofen for pain or fevers Follow up as needed for continued or worsening symptoms

## 2018-04-28 NOTE — ED Triage Notes (Signed)
Per pt he has been having chills, congestion, fevers, and very fatigue. Pt was placed on Z-pack 2 weeks ago and tamiflu this past Monday. Pt not feeling any better. Still having low grade fevers and chills.

## 2018-04-29 NOTE — ED Provider Notes (Addendum)
[facility]    CSN: [id number] Arrival date & time: 04/28/18  1413     History   Chief Complaint Chief Complaint  Patient presents with  . Cough    fevers  . Nasal Congestion    HPI [name] is a 33 y.o. male.   Patient is a 33 year old male that presents with cough, congestion, nasal congestion, rhinorrhea, low-grade fevers, body aches.  This is been present for over 2 weeks.  He was seen approximately 2 weeks ago and given Z-Pak and Tamiflu for flulike symptoms.  Since finishing the treatment he is still remains with low-grade fever cough, congestion, wheezing.  He is also had some sinus pressure and mild fatigue.  Symptoms have been waxing and waning.  His symptoms are worse at night.  ROS per HPI   Cough  Associated symptoms: chills, ear pain, fever, myalgias, rhinorrhea and wheezing   Associated symptoms: no rash     Past Medical History:  Diagnosis Date  . Atypical chest pain 04/2017   w/SOB. Pain radiates down left arm at times. Fam Hx of heart disease  . CTS (carpal tunnel syndrome) 2009   (R)  . Diabetes mellitus 05/1997   type I   . DKA (diabetic ketoacidoses) ([place]) 2003 & 2004  . GERD (gastroesophageal reflux disease)   . Hypothyroidism   . Pilonidal cyst 2008  . Seizure ([place]) 2002   hypoglycemic  . Seizures ([place]) 1990, 1991   febrile seizures x 3    Patient Active Problem List   Diagnosis Date Noted  . Herpes zoster 07/28/2013  . Paronychia of finger 07/28/2013  . Acute renal failure ([place]) 02/19/2013  . Cellulitis 02/19/2013  . Pain in thoracic spine 03/11/2011  . Cervical pain 03/11/2011    Past Surgical History:  Procedure Laterality Date  . none    . TYMPANOSTOMY TUBE PLACEMENT  1990       Home Medications    Prior to Admission medications   Medication Sig Start Date End Date Taking? Authorizing Provider  albuterol (PROVENTIL HFA;VENTOLIN HFA) 108 (90 Base) MCG/ACT inhaler Inhale 1-2 puffs into the lungs every 6  (six) hours as needed for wheezing or shortness of breath. 04/28/18   Venita Seng A, NP  amoxicillin-clavulanate (AUGMENTIN) 875-125 MG tablet Take 1 tablet by mouth every 12 (twelve) hours. 04/28/18   Loura Halt A, NP  Insulin Disposable Pump (OMNIPOD DASH SYSTEM) KIT by Does not apply route.    [provider]  insulin glargine (LANTUS) 100 UNIT/ML injection Inject 38-40 Units into the skin every morning.     [provider]  levothyroxine (SYNTHROID, LEVOTHROID) 100 MCG tablet Take 100 mcg by mouth daily before breakfast.    [provider]  omeprazole (PRILOSEC) 40 MG capsule Take 40 mg by mouth daily. 04/14/17   [provider]  predniSONE (DELTASONE) 10 MG tablet Take 2 tablets (20 mg total) by mouth daily for 5 days. 04/28/18 05/03/18  Orvan July, NP    Family History Family History  Problem Relation Age of Onset  . Heart disease Other   . Hypertension Maternal Grandfather   . High Cholesterol Maternal Grandfather   . Hypertension Paternal Grandfather   . High Cholesterol Paternal Grandfather     Social History Social History   Tobacco Use  . Smoking status: Former Smoker    Packs/day: 1.00    Years: 10.00    Pack years: 10.00    Types: Cigarettes  . Smokeless  tobacco: Never Used  Substance Use Topics  . Alcohol use: Yes    Comment: occasional  . Drug use: No     Allergies   Codeine and Prednisone   Review of Systems Review of Systems  Constitutional: Positive for activity change, chills, fatigue and fever.  HENT: Positive for congestion, ear pain, rhinorrhea, sinus pressure and sinus pain.   Respiratory: Positive for cough, chest tightness and wheezing.   Musculoskeletal: Positive for myalgias.  Skin: Negative for color change, pallor, rash and wound.     Physical Exam Triage Vital Signs ED Triage Vitals  Enc Vitals Group     BP 04/28/18 1433 128/83     Pulse Rate 04/28/18 1433 87     Resp 04/28/18 1433 16     Temp  04/28/18 1433 97.6 F (36.4 C)     Temp Source 04/28/18 1433 Oral     SpO2 04/28/18 1433 98 %     Weight 04/28/18 1434 182 lb (82.6 kg)     Height 04/28/18 1434 _0  (1.702 m)     Head Circumference --      Peak Flow --      Pain Score 04/28/18 1433 5     Pain Loc --      Pain Edu? --      Excl. in [place]? --    No data found.  Updated Vital Signs BP 128/83 (BP Location: Right Arm)   Pulse 87   Temp 97.6 F (36.4 C) (Oral)   Resp 16   Ht _1  (1.702 m)   Wt 182 lb (82.6 kg)   SpO2 98%   BMI 28.51 kg/m   Visual Acuity Right Eye Distance:   Left Eye Distance:   Bilateral Distance:    Right Eye Near:   Left Eye Near:    Bilateral Near:     Physical Exam Vitals signs and nursing note reviewed.  Constitutional:      Appearance: He is well-developed. He is not toxic-appearing.  HENT:     Head: Normocephalic and atraumatic.     Right Ear: Tympanic membrane and ear canal normal.     Left Ear: Tympanic membrane and ear canal normal.     Nose: Nasal tenderness, mucosal edema, congestion and rhinorrhea present.     Right Turbinates: Swollen.     Left Turbinates: Swollen.     Right Sinus: Frontal sinus tenderness present.     Left Sinus: Frontal sinus tenderness present.     Mouth/Throat:     Pharynx: Oropharynx is clear.  Eyes:     Conjunctiva/sclera: Conjunctivae normal.  Neck:     Musculoskeletal: Neck supple. No neck rigidity or muscular tenderness.  Cardiovascular:     Rate and Rhythm: Normal rate and regular rhythm.     Heart sounds: No murmur.  Pulmonary:     Effort: Pulmonary effort is normal. No respiratory distress.     Breath sounds: Wheezing present.  Abdominal:     Palpations: Abdomen is soft.     Tenderness: There is no abdominal tenderness.  Musculoskeletal: Normal range of motion.  Lymphadenopathy:     Cervical: No cervical adenopathy.  Skin:    General: Skin is warm and dry.  Neurological:     Mental Status: He is alert.  Psychiatric:         Mood and Affect: Mood normal.      UC Treatments / Results  Labs (all labs ordered are listed, but only abnormal results  are displayed) Labs Reviewed - No data to display  EKG None  Radiology Dg Chest 2 View  Result Date: 04/28/2018 CLINICAL DATA:  Cough and chest discomfort. The patient was diagnosed with flu 1 week ago. EXAM: CHEST - 2 VIEW COMPARISON:  01/11/2011 FINDINGS: Cardiomediastinal silhouette is normal. Mediastinal contours appear intact. There is no evidence of focal airspace consolidation, pleural effusion or pneumothorax. Osseous structures are without acute abnormality. Soft tissues are grossly normal. IMPRESSION: No active cardiopulmonary disease. Electronically Signed   By: Fidela Salisbury M.D.   On: 04/28/2018 15:10    Procedures Procedures (including critical care time)  Medications Ordered in UC Medications - No data to display  Initial Impression / Assessment and Plan / UC Course  I have reviewed the triage vital signs and the nursing notes.  Pertinent labs & imaging results that were available during my care of the patient were reviewed by me and considered in my medical decision making (see chart for details).     Patient is a 33 year old male that presents with continued symptoms despite treatment approximate 2 weeks ago for flu. It appears today that he has a acute sinusitis. He also had mild expiratory wheezing on exam which could be representative of bronchitis. His checks x-ray was negative for pneumonia  I feel it is appropriate to go ahead and treat for an acute sinus infection and bronchitis. Augmentin twice a day for 7 days for the sinus infection Prednisone daily for 5 days for the inflammation Albuterol inhaler to use as needed for cough, wheezing, shortness of breath Follow up as needed for continued or worsening symptoms  Final Clinical Impressions(s) / UC Diagnoses   Final diagnoses:  Acute recurrent frontal sinusitis      Discharge Instructions     We will go ahead and treat you for a sinus infection and the inflammation in your lungs Augmentin twice a day for 7 days for the infection Prednisone daily for the next 5 days for inflammation Albuterol inhaler to use as needed for cough, wheezing, shortness of breath Tylenol/ibuprofen for pain or fevers Follow up as needed for continued or worsening symptoms     ED Prescriptions    Medication Sig Dispense Auth. Provider   predniSONE (DELTASONE) 10 MG tablet Take 2 tablets (20 mg total) by mouth daily for 5 days. 10 tablet Patrese Neal A, NP   albuterol (PROVENTIL HFA;VENTOLIN HFA) 108 (90 Base) MCG/ACT inhaler Inhale 1-2 puffs into the lungs every 6 (six) hours as needed for wheezing or shortness of breath. 1 Inhaler Lakiesha Ralphs A, NP   amoxicillin-clavulanate (AUGMENTIN) 875-125 MG tablet Take 1 tablet by mouth every 12 (twelve) hours. 14 tablet Orvan July, NP     Controlled Substance Prescriptions [place] Controlled Substance Registry consulted? no   Orvan July, NP 04/29/18 1502    Loura Halt A, NP 04/29/18 1504

## 2018-05-22 MED FILL — OMEPRAZOLE 40 MG CPDR: 40 | 30 days supply | Qty: 30 | Fill #6

## 2018-05-24 ENCOUNTER — Ambulatory Visit: Payer: No Typology Code available for payment source | Admitting: Internal Medicine

## 2018-05-24 ENCOUNTER — Other Ambulatory Visit (INDEPENDENT_AMBULATORY_CARE_PROVIDER_SITE_OTHER): Payer: 59

## 2018-05-24 ENCOUNTER — Encounter: Payer: Self-pay | Admitting: Internal Medicine

## 2018-05-24 VITALS — BP 100/62 | HR 64 | Ht 67.0 in | Wt 186.0 lb

## 2018-05-24 DIAGNOSIS — R140 Abdominal distension (gaseous): Secondary | ICD-10-CM | POA: Diagnosis not present

## 2018-05-24 DIAGNOSIS — K5900 Constipation, unspecified: Secondary | ICD-10-CM

## 2018-05-24 DIAGNOSIS — R1030 Lower abdominal pain, unspecified: Secondary | ICD-10-CM

## 2018-05-24 DIAGNOSIS — Z8379 Family history of other diseases of the digestive system: Secondary | ICD-10-CM | POA: Diagnosis not present

## 2018-05-24 LAB — IGA: IgA: 273 mg/dL (ref 68–378)

## 2018-05-24 NOTE — Progress Notes (Signed)
Patient ID: [name], male   DOB: [birth year], 32 y.o.   MRN: [id number] HPI: [name] is a 33 yo male with PMH of Type 1 DM diagnosed around age 70, hypothyroidism, GERD seen in consultation at the request of Dr. Forde Dandy to evaluate abd bloating, constipation, and abd pain.  He is here today with his wife.    He reports that he has been dealing with abdominal bloating and occasional lower abdominal pain.  He has had a slow and subtle decrease in frequency of bowel movement.  He used to have bowel movements once daily but over the last 1+ year he has been having bowel movements every 3 to 4 days.  On the days that he has bowel movements now he has multiple bowel movements throughout the day which are larger volume.  No blood in his stool or melena.  Some lower abdominal crampy pain which proceeds bowel movement and is relieved by bowel movement.  He does feel that he has more bloating and gas.  Occasional belching but no heartburn in the setting of omeprazole 40 mg daily.  No dysphagia or odynophagia.  He works as a Dealer.  He has an insulin pump and continuous glucose monitoring.  No alcohol use.  No tobacco use.  Family history notable for mother with celiac disease and a father with irritable bowel syndrome.  1 year ago he did start Cymbalta for mood which is helped significantly per patient and his wife.  Past Medical History:  Diagnosis Date  . Atypical chest pain 04/2017   w/SOB. Pain radiates down left arm at times. Fam Hx of heart disease  . CTS (carpal tunnel syndrome) 2009   (R)  . Diabetes mellitus 05/1997   type I   . DKA (diabetic ketoacidoses) ([place]) 2003 & 2004  . GERD (gastroesophageal reflux disease)   . Hypothyroidism   . Pilonidal cyst 2008  . Seizure ([place]) 2002   hypoglycemic  . Seizures ([place]) 1990, 1991   febrile seizures x 3    Past Surgical History:  Procedure Laterality Date  . none    . TYMPANOSTOMY TUBE PLACEMENT  1990    Outpatient Medications  Prior to Visit  Medication Sig Dispense Refill  . DULoxetine (CYMBALTA) 60 MG capsule Take 60 mg by mouth daily.    . Insulin Disposable Pump (OMNIPOD DASH SYSTEM) KIT by Does not apply route.    Marland Kitchen levothyroxine (SYNTHROID, LEVOTHROID) 100 MCG tablet Take 100 mcg by mouth daily before breakfast.    . omeprazole (PRILOSEC) 40 MG capsule Take 40 mg by mouth daily.  2  . albuterol (PROVENTIL HFA;VENTOLIN HFA) 108 (90 Base) MCG/ACT inhaler Inhale 1-2 puffs into the lungs every 6 (six) hours as needed for wheezing or shortness of breath. (Patient not taking: Reported on 05/24/2018) 1 Inhaler 0  . amoxicillin-clavulanate (AUGMENTIN) 875-125 MG tablet Take 1 tablet by mouth every 12 (twelve) hours. (Patient not taking: Reported on 05/24/2018) 14 tablet 0  . insulin glargine (LANTUS) 100 UNIT/ML injection Inject 38-40 Units into the skin every morning.      No facility-administered medications prior to visit.     Allergies  Allergen Reactions  . Codeine   . Prednisone     Extreme hyperglycemia    Family History  Problem Relation Age of Onset  . Heart disease Other   . Hypertension Maternal Grandfather   . High Cholesterol Maternal Grandfather   . Hypertension Paternal Grandfather   . High Cholesterol Paternal Grandfather   .  Celiac disease Mother   . Irritable bowel syndrome Father     Social History   Tobacco Use  . Smoking status: Former Smoker    Packs/day: 1.00    Years: 10.00    Pack years: 10.00    Types: Cigarettes  . Smokeless tobacco: Never Used  Substance Use Topics  . Alcohol use: Yes    Comment: occasional  . Drug use: No    ROS: As per history of present illness, otherwise negative  BP 100/62   Pulse 64   Ht 5' 7"  (1.702 m)   Wt 186 lb (84.4 kg)   BMI 29.13 kg/m  Constitutional: Well-developed and well-nourished. No distress. HEENT: Normocephalic and atraumatic. Oropharynx is clear and moist. Conjunctivae are normal.  No scleral icterus. Neck: Neck supple.  Trachea midline. Cardiovascular: Normal rate, regular rhythm and intact distal pulses. No M/R/G Pulmonary/chest: Effort normal and breath sounds normal. No wheezing, rales or rhonchi. Abdominal: Soft, mild lower abdominal discomfort with palpation without rebound or guarding, nondistended. Bowel sounds active throughout. There are no masses palpable. No hepatosplenomegaly. Extremities: no clubbing, cyanosis, or edema Neurological: Alert and oriented to person place and time. Skin: Skin is warm and dry.  Psychiatric: Normal mood and affect. Behavior is normal.  RELEVANT LABS AND IMAGING: CBC    Component Value Date/Time   WBC 11.6 (H) 05/17/2008 1610   RBC 5.40 05/17/2008 1610   HGB 16.4 05/17/2008 1610   HCT 46.0 05/17/2008 1610   PLT 207 05/17/2008 1610   MCV 85.2 05/17/2008 1610   MCHC 35.6 05/17/2008 1610   RDW 12.3 05/17/2008 1610   LYMPHSABS 0.7 05/17/2008 1610   MONOABS 0.8 05/17/2008 1610   EOSABS 0.2 05/17/2008 1610   BASOSABS 0.1 05/17/2008 1610    CMP     Component Value Date/Time   NA 134 (L) 05/17/2008 1610   K 4.1 05/17/2008 1610   CL 98 05/17/2008 1610   CO2 26 05/17/2008 1610   GLUCOSE 287 (H) 05/17/2008 1610   BUN 19 05/17/2008 1610   CREATININE 0.98 05/17/2008 1610   CALCIUM 9.2 05/17/2008 1610   GFRNONAA >60 05/17/2008 1610   GFRAA  05/17/2008 1610    >60        The eGFR has been calculated using the MDRD equation. This calculation has not been validated in all clinical situations. eGFR's persistently <60 mL/min signify possible Chronic Kidney Disease.    ASSESSMENT/PLAN: 33 yo male with PMH of Type 1 DM diagnosed around age 82, hypothyroidism, GERD seen in consultation at the request of Dr. Forde Dandy to evaluate abd bloating, constipation, and abd pain.  1.  Constipation/abdominal pain and bloating --very likely constipation is the cause of all of his symptoms which are irritable in nature.  We discussed this at length today.  Given family history  of celiac disease I will exclude celiac by checking TTG level.  Suspicion for celiac is low.  I have recommended that he increase his water intake which she admits to not drinking much fluid throughout the day.  Also increase fiber.  May need a daily laxative such as MiraLAX or Linzess but would first like to try increased fiber and water intake. --Celiac panel --Increase water and fluid intake daily --Begin Benefiber 1 working to 2 heaping tablespoons daily --He is asked to try Benefiber for 2 to 4 weeks and then let me know if symptoms have improved. --SIBO considered in the setting of his diabetes but will treat for constipation with irritable  bowel first.   PI:[facility], [address], [address], [place] [address]

## 2018-05-24 NOTE — Patient Instructions (Signed)
Your provider has requested that you go to the basement level for lab work before leaving today. Press "B" on the elevator. The lab is located at the first door on the left as you exit the elevator.  Please purchase the following medications over the counter and take as directed: Benefiber 2 teaspoons daily  Please increase your water intake as much as possible and call us back in 2-4 weeks with an update on how your symptoms are.  If you are age 31 or older, your body mass index should be between 23-30. Your Body mass index is 29.13 kg/m. If this is out of the aforementioned range listed, please consider follow up with your Primary Care Provider.  If you are age 71 or younger, your body mass index should be between 19-25. Your Body mass index is 29.13 kg/m. If this is out of the aformentioned range listed, please consider follow up with your Primary Care Provider.

## 2018-05-25 LAB — TISSUE TRANSGLUTAMINASE, IGA: (TTG) AB, IGA: 4 U/mL — AB

## 2018-05-29 ENCOUNTER — Encounter: Payer: Self-pay | Admitting: Internal Medicine

## 2018-06-01 ENCOUNTER — Telehealth: Payer: Self-pay | Admitting: *Deleted

## 2018-06-01 NOTE — Telephone Encounter (Signed)
Insulin pump letter faxed both electronically to Dr Evlyn Kanner and manually to Dr Evlyn Kanner at fax [phone]. Will await response.

## 2018-06-01 NOTE — Telephone Encounter (Signed)
Patient seen in office on 05/24/18, scheduled for EGD Thursday, 06/14/18 ay 8 am. Patient has an insulin pump. Please send for instructions for pump from PCP or Endocrine MD Patient's Previsit is scheduled for 06/05/18

## 2018-06-04 DIAGNOSIS — H5203 Hypermetropia, bilateral: Secondary | ICD-10-CM | POA: Diagnosis not present

## 2018-06-04 DIAGNOSIS — H52203 Unspecified astigmatism, bilateral: Secondary | ICD-10-CM | POA: Diagnosis not present

## 2018-06-04 DIAGNOSIS — E119 Type 2 diabetes mellitus without complications: Secondary | ICD-10-CM | POA: Diagnosis not present

## 2018-06-04 MED FILL — HumaLOG 100 UNIT/ML SOLN: 100 | 40 days supply | Qty: 40 | Fill #0 | Status: TO

## 2018-06-08 ENCOUNTER — Telehealth: Payer: Self-pay | Admitting: *Deleted

## 2018-06-08 NOTE — Telephone Encounter (Signed)
-----   Message from Richardson [place], [place] sent at 06/01/2018  3:59 PM EST ----- Did dr Evlyn Kanner send insulin instructions for pt procedure 06/22/18?

## 2018-06-08 NOTE — Telephone Encounter (Signed)
Left message for Dr Rinaldo Cloud office that we need response to insulin pump letter for patient's upcoming procedure on 06/14/18.

## 2018-06-11 NOTE — Telephone Encounter (Signed)
I have again left a voicemail for Morrie Sheldon a nurse with Dr Timothy Lasso (covering for Dr Evlyn Kanner today) to call back with insulin pump instructions for patient's upcoming endoscopy procedure with our office as we have not yet received any advise from their office. I will await a return call/fax.

## 2018-06-11 NOTE — Telephone Encounter (Signed)
Heather from Dr Lyndle Herrlich office called back. She states they never got our fax. She has asked that we fax letter to [phone] attn: Misty Stanley. I have refaxed letter to [facility].

## 2018-06-12 NOTE — Telephone Encounter (Signed)
We have received response from Dr Evlyn Kanner by fax indicating, "would continue a basal alone for the night before and day of procedure. Dr Evlyn Kanner"  I have contacted patient to make him aware of this. Patient verbalizes understanding. He currently does not have a procedure scheduled as he cancelled the original procedure scheduled for 06/14/18 since he needs to get his son that date, however his wife will be calling back later today to get procedure and previsit rescheduled.

## 2018-06-13 DIAGNOSIS — E1040 Type 1 diabetes mellitus with diabetic neuropathy, unspecified: Secondary | ICD-10-CM | POA: Diagnosis not present

## 2018-06-13 DIAGNOSIS — E1065 Type 1 diabetes mellitus with hyperglycemia: Secondary | ICD-10-CM | POA: Diagnosis not present

## 2018-06-13 DIAGNOSIS — E109 Type 1 diabetes mellitus without complications: Secondary | ICD-10-CM | POA: Diagnosis not present

## 2018-06-13 DIAGNOSIS — Z794 Long term (current) use of insulin: Secondary | ICD-10-CM | POA: Diagnosis not present

## 2018-06-14 ENCOUNTER — Encounter: Payer: 59 | Admitting: Internal Medicine

## 2018-06-14 MED FILL — LEVOTHYROXINE 100 MCG TABLE: 100 | 90 days supply | Qty: 90 | Fill #0

## 2018-06-15 MED FILL — OMEPRAZOLE 40 MG CPDR: 40 | 30 days supply | Qty: 30 | Fill #7 | Status: TO

## 2018-07-14 DIAGNOSIS — Z794 Long term (current) use of insulin: Secondary | ICD-10-CM | POA: Diagnosis not present

## 2018-07-14 DIAGNOSIS — E109 Type 1 diabetes mellitus without complications: Secondary | ICD-10-CM | POA: Diagnosis not present

## 2018-07-14 DIAGNOSIS — E1040 Type 1 diabetes mellitus with diabetic neuropathy, unspecified: Secondary | ICD-10-CM | POA: Diagnosis not present

## 2018-07-14 DIAGNOSIS — E1065 Type 1 diabetes mellitus with hyperglycemia: Secondary | ICD-10-CM | POA: Diagnosis not present

## 2018-07-24 MED FILL — HumaLOG 100 UNIT/ML SOLN: 100 | 40 days supply | Qty: 40 | Fill #0 | Status: TO

## 2018-07-24 MED FILL — DULOXETINE HCL 60 MG CPEP: 60 | 90 days supply | Qty: 90 | Fill #0

## 2018-07-24 MED FILL — OMEPRAZOLE 40 MG CPDR: 40 | 30 days supply | Qty: 30 | Fill #0 | Status: TO

## 2018-07-25 ENCOUNTER — Telehealth: Payer: Self-pay | Admitting: *Deleted

## 2018-07-25 NOTE — Telephone Encounter (Signed)
07/25/18 LMOM @ 0948 am, re: follow up appointment.

## 2018-08-13 DIAGNOSIS — E1040 Type 1 diabetes mellitus with diabetic neuropathy, unspecified: Secondary | ICD-10-CM | POA: Diagnosis not present

## 2018-08-13 DIAGNOSIS — E109 Type 1 diabetes mellitus without complications: Secondary | ICD-10-CM | POA: Diagnosis not present

## 2018-08-13 DIAGNOSIS — Z794 Long term (current) use of insulin: Secondary | ICD-10-CM | POA: Diagnosis not present

## 2018-08-13 DIAGNOSIS — E1065 Type 1 diabetes mellitus with hyperglycemia: Secondary | ICD-10-CM | POA: Diagnosis not present

## 2018-08-20 DIAGNOSIS — E7849 Other hyperlipidemia: Secondary | ICD-10-CM | POA: Diagnosis not present

## 2018-08-20 DIAGNOSIS — E039 Hypothyroidism, unspecified: Secondary | ICD-10-CM | POA: Diagnosis not present

## 2018-08-20 DIAGNOSIS — F418 Other specified anxiety disorders: Secondary | ICD-10-CM | POA: Diagnosis not present

## 2018-08-20 DIAGNOSIS — F329 Major depressive disorder, single episode, unspecified: Secondary | ICD-10-CM | POA: Diagnosis not present

## 2018-08-20 DIAGNOSIS — K219 Gastro-esophageal reflux disease without esophagitis: Secondary | ICD-10-CM | POA: Diagnosis not present

## 2018-08-20 DIAGNOSIS — Z794 Long term (current) use of insulin: Secondary | ICD-10-CM | POA: Diagnosis not present

## 2018-08-20 DIAGNOSIS — Z1331 Encounter for screening for depression: Secondary | ICD-10-CM | POA: Diagnosis not present

## 2018-08-20 DIAGNOSIS — E1040 Type 1 diabetes mellitus with diabetic neuropathy, unspecified: Secondary | ICD-10-CM | POA: Diagnosis not present

## 2018-08-30 MED FILL — OMEPRAZOLE 40 MG CPDR: 40 | 30 days supply | Qty: 30 | Fill #0

## 2018-08-30 MED FILL — HumaLOG 100 UNIT/ML SOLN: 100 | 40 days supply | Qty: 40 | Fill #0

## 2018-09-27 DIAGNOSIS — Z794 Long term (current) use of insulin: Secondary | ICD-10-CM | POA: Diagnosis not present

## 2018-09-27 DIAGNOSIS — E1065 Type 1 diabetes mellitus with hyperglycemia: Secondary | ICD-10-CM | POA: Diagnosis not present

## 2018-09-27 DIAGNOSIS — E1040 Type 1 diabetes mellitus with diabetic neuropathy, unspecified: Secondary | ICD-10-CM | POA: Diagnosis not present

## 2018-09-27 DIAGNOSIS — E109 Type 1 diabetes mellitus without complications: Secondary | ICD-10-CM | POA: Diagnosis not present

## 2018-10-01 MED FILL — HumaLOG 100 UNIT/ML SOLN: 100 | 40 days supply | Qty: 40 | Fill #1

## 2018-10-01 MED FILL — LEVOTHYROXINE 100 MCG TABLE: 100 | 90 days supply | Qty: 90 | Fill #1

## 2018-10-01 MED FILL — OMEPRAZOLE DR 40 MG CAPSULE: 40 | 90 days supply | Qty: 90 | Fill #0

## 2018-11-05 MED FILL — DULoxetine HCL 60 MG CPEP: 60 | 90 days supply | Qty: 90 | Fill #0

## 2018-11-05 MED FILL — HumaLOG 100 UNIT/ML SOLN: 100 | 40 days supply | Qty: 40 | Fill #2

## 2018-12-21 DIAGNOSIS — E1065 Type 1 diabetes mellitus with hyperglycemia: Secondary | ICD-10-CM | POA: Diagnosis not present

## 2018-12-24 DIAGNOSIS — E1040 Type 1 diabetes mellitus with diabetic neuropathy, unspecified: Secondary | ICD-10-CM | POA: Diagnosis not present

## 2018-12-24 DIAGNOSIS — E1042 Type 1 diabetes mellitus with diabetic polyneuropathy: Secondary | ICD-10-CM | POA: Diagnosis not present

## 2018-12-24 DIAGNOSIS — K219 Gastro-esophageal reflux disease without esophagitis: Secondary | ICD-10-CM | POA: Diagnosis not present

## 2018-12-24 DIAGNOSIS — F418 Other specified anxiety disorders: Secondary | ICD-10-CM | POA: Diagnosis not present

## 2018-12-24 DIAGNOSIS — E039 Hypothyroidism, unspecified: Secondary | ICD-10-CM | POA: Diagnosis not present

## 2018-12-24 DIAGNOSIS — E785 Hyperlipidemia, unspecified: Secondary | ICD-10-CM | POA: Diagnosis not present

## 2018-12-24 DIAGNOSIS — Z794 Long term (current) use of insulin: Secondary | ICD-10-CM | POA: Diagnosis not present

## 2018-12-27 MED FILL — HumaLOG 100 UNIT/ML SOLN: 100 | 40 days supply | Qty: 40 | Fill #3

## 2018-12-27 MED FILL — OMEPRAZOLE DR 40 MG CAPSULE: 40 | 90 days supply | Qty: 90 | Fill #1

## 2018-12-27 MED FILL — LEVOTHYROXINE 100 MCG TABLE: 100 | 90 days supply | Qty: 90 | Fill #0

## 2018-12-28 DIAGNOSIS — E109 Type 1 diabetes mellitus without complications: Secondary | ICD-10-CM | POA: Diagnosis not present

## 2018-12-28 DIAGNOSIS — E1040 Type 1 diabetes mellitus with diabetic neuropathy, unspecified: Secondary | ICD-10-CM | POA: Diagnosis not present

## 2018-12-28 DIAGNOSIS — Z794 Long term (current) use of insulin: Secondary | ICD-10-CM | POA: Diagnosis not present

## 2018-12-28 DIAGNOSIS — E1065 Type 1 diabetes mellitus with hyperglycemia: Secondary | ICD-10-CM | POA: Diagnosis not present

## 2019-01-18 MED FILL — CIPROFLOXACIN HCL 500 MG TA: 500 | 7 days supply | Qty: 14 | Fill #0

## 2019-02-14 MED FILL — HumaLOG 100 UNIT/ML SOLN: 100 | 40 days supply | Qty: 40 | Fill #0

## 2019-02-14 MED FILL — DULoxetine HCL 60 MG CPEP: 60 | 90 days supply | Qty: 90 | Fill #1

## 2019-03-26 DIAGNOSIS — E1040 Type 1 diabetes mellitus with diabetic neuropathy, unspecified: Secondary | ICD-10-CM | POA: Diagnosis not present

## 2019-03-26 DIAGNOSIS — E1065 Type 1 diabetes mellitus with hyperglycemia: Secondary | ICD-10-CM | POA: Diagnosis not present

## 2019-03-26 DIAGNOSIS — E109 Type 1 diabetes mellitus without complications: Secondary | ICD-10-CM | POA: Diagnosis not present

## 2019-03-26 DIAGNOSIS — Z794 Long term (current) use of insulin: Secondary | ICD-10-CM | POA: Diagnosis not present

## 2019-04-02 MED FILL — LEVOTHYROXINE 100 MCG TABLE: 100 | 90 days supply | Qty: 90 | Fill #1

## 2019-04-02 MED FILL — OMEPRAZOLE 40 MG CPDR: 40 | 90 days supply | Qty: 90 | Fill #2

## 2019-04-02 MED FILL — HumaLOG 100 UNIT/ML SOLN: 100 | 40 days supply | Qty: 40 | Fill #1

## 2019-04-19 DIAGNOSIS — E785 Hyperlipidemia, unspecified: Secondary | ICD-10-CM | POA: Diagnosis not present

## 2019-04-19 DIAGNOSIS — E039 Hypothyroidism, unspecified: Secondary | ICD-10-CM | POA: Diagnosis not present

## 2019-04-19 DIAGNOSIS — F329 Major depressive disorder, single episode, unspecified: Secondary | ICD-10-CM | POA: Diagnosis not present

## 2019-04-19 DIAGNOSIS — Z794 Long term (current) use of insulin: Secondary | ICD-10-CM | POA: Diagnosis not present

## 2019-04-19 DIAGNOSIS — R0789 Other chest pain: Secondary | ICD-10-CM | POA: Diagnosis not present

## 2019-04-19 DIAGNOSIS — E1040 Type 1 diabetes mellitus with diabetic neuropathy, unspecified: Secondary | ICD-10-CM | POA: Diagnosis not present

## 2019-05-10 DIAGNOSIS — E7849 Other hyperlipidemia: Secondary | ICD-10-CM | POA: Diagnosis not present

## 2019-05-10 DIAGNOSIS — E1040 Type 1 diabetes mellitus with diabetic neuropathy, unspecified: Secondary | ICD-10-CM | POA: Diagnosis not present

## 2019-05-10 DIAGNOSIS — E039 Hypothyroidism, unspecified: Secondary | ICD-10-CM | POA: Diagnosis not present

## 2019-05-13 DIAGNOSIS — R5383 Other fatigue: Secondary | ICD-10-CM | POA: Diagnosis not present

## 2019-05-14 MED FILL — ROSUVASTATIN CALCIUM 5 MG T: 5 | 90 days supply | Qty: 90 | Fill #0

## 2019-05-14 MED FILL — HumaLOG 100 UNIT/ML SOLN: 100 | 40 days supply | Qty: 40 | Fill #2

## 2019-05-14 MED FILL — DULoxetine HCL 60 MG CPEP: 60 | 90 days supply | Qty: 90 | Fill #2

## 2019-05-15 DIAGNOSIS — E7849 Other hyperlipidemia: Secondary | ICD-10-CM | POA: Diagnosis not present

## 2019-05-15 DIAGNOSIS — M2550 Pain in unspecified joint: Secondary | ICD-10-CM | POA: Diagnosis not present

## 2019-05-16 DIAGNOSIS — M2550 Pain in unspecified joint: Secondary | ICD-10-CM | POA: Diagnosis not present

## 2019-05-16 DIAGNOSIS — E7849 Other hyperlipidemia: Secondary | ICD-10-CM | POA: Diagnosis not present

## 2019-05-30 DIAGNOSIS — Z794 Long term (current) use of insulin: Secondary | ICD-10-CM | POA: Diagnosis not present

## 2019-05-30 DIAGNOSIS — E538 Deficiency of other specified B group vitamins: Secondary | ICD-10-CM | POA: Diagnosis not present

## 2019-05-30 DIAGNOSIS — Z1331 Encounter for screening for depression: Secondary | ICD-10-CM | POA: Diagnosis not present

## 2019-05-30 DIAGNOSIS — E1040 Type 1 diabetes mellitus with diabetic neuropathy, unspecified: Secondary | ICD-10-CM | POA: Diagnosis not present

## 2019-05-30 DIAGNOSIS — F329 Major depressive disorder, single episode, unspecified: Secondary | ICD-10-CM | POA: Diagnosis not present

## 2019-05-30 DIAGNOSIS — R5383 Other fatigue: Secondary | ICD-10-CM | POA: Diagnosis not present

## 2019-05-30 DIAGNOSIS — E039 Hypothyroidism, unspecified: Secondary | ICD-10-CM | POA: Diagnosis not present

## 2019-05-30 MED FILL — buPROPion HCL 75 MG TABS: 75 | 90 days supply | Qty: 90 | Fill #0

## 2019-06-26 MED FILL — OMEPRAZOLE 40 MG CPDR: 40 | 90 days supply | Qty: 90 | Fill #0

## 2019-06-26 MED FILL — LEVOTHYROXINE 100 MCG TABLE: 100 | 90 days supply | Qty: 90 | Fill #0

## 2019-06-26 MED FILL — HumaLOG 100 UNIT/ML SOLN: 100 | 40 days supply | Qty: 40 | Fill #3

## 2019-07-01 DIAGNOSIS — R5383 Other fatigue: Secondary | ICD-10-CM | POA: Diagnosis not present

## 2019-07-08 ENCOUNTER — Telehealth: Payer: Self-pay | Admitting: *Deleted

## 2019-07-08 NOTE — Telephone Encounter (Signed)
Need orders for patient's insulin pump for day of procedure, thanks!

## 2019-07-09 NOTE — Telephone Encounter (Signed)
Letter faxed to Dr Rinaldo Cloud office at fax [phone]. Also sent electronically. Will await return response.

## 2019-07-12 ENCOUNTER — Other Ambulatory Visit: Payer: Self-pay

## 2019-07-12 ENCOUNTER — Ambulatory Visit (AMBULATORY_SURGERY_CENTER): Payer: Self-pay

## 2019-07-12 VITALS — Temp 97.1°F | Ht 67.0 in | Wt 198.1 lb

## 2019-07-12 DIAGNOSIS — R0789 Other chest pain: Secondary | ICD-10-CM

## 2019-07-12 NOTE — Progress Notes (Signed)
No allergies to soy or egg Pt is not on blood thinners or diet pills Denies issues with sedation/intubation Denies atrial flutter/fib Denies constipation   Emmi instructions given to pt  Pt is aware of Covid safety and care partner requirements.  Care Partner 97.7

## 2019-07-15 NOTE — Telephone Encounter (Signed)
We have received faxed correspondence from Dr Evlyn Kanner indicating "should be able to continue basal rates with omnipod. If wishes, could do a 10% temporary basal reduction. -Adrian Prince, MD"  I have left a message for patient to call back.  See original corrrespondence under "media" tab in Epic.

## 2019-07-16 NOTE — Telephone Encounter (Signed)
Patient has been advised that per Dr Evlyn Kanner, he should be able to continue basal rates with omnipod. If wishes, could do a 10% temporary basal reduction. Patient verbalizes understanding of this.

## 2019-07-23 ENCOUNTER — Other Ambulatory Visit: Payer: Self-pay

## 2019-07-23 ENCOUNTER — Other Ambulatory Visit (HOSPITAL_COMMUNITY)
Admission: RE | Admit: 2019-07-23 | Discharge: 2019-07-23 | Disposition: A | Discharge status: Home / Self Care | Payer: 59 | Source: Ambulatory Visit | Attending: Internal Medicine | Admitting: Internal Medicine

## 2019-07-23 DIAGNOSIS — Z20822 Contact with and (suspected) exposure to covid-19: Secondary | ICD-10-CM | POA: Insufficient documentation

## 2019-07-23 DIAGNOSIS — Z01812 Encounter for preprocedural laboratory examination: Secondary | ICD-10-CM | POA: Diagnosis not present

## 2019-07-24 LAB — SARS CORONAVIRUS 2 (TAT 6-24 HRS): SARS Coronavirus 2: NEGATIVE

## 2019-07-25 ENCOUNTER — Encounter: Payer: Self-pay | Admitting: Internal Medicine

## 2019-07-26 ENCOUNTER — Ambulatory Visit (AMBULATORY_SURGERY_CENTER): Payer: 59 | Admitting: Internal Medicine

## 2019-07-26 ENCOUNTER — Encounter: Payer: Self-pay | Admitting: Internal Medicine

## 2019-07-26 ENCOUNTER — Other Ambulatory Visit: Payer: Self-pay

## 2019-07-26 VITALS — BP 115/63 | HR 82 | Temp 97.5°F | Resp 14 | Ht 67.0 in | Wt 189.0 lb

## 2019-07-26 DIAGNOSIS — R1013 Epigastric pain: Secondary | ICD-10-CM | POA: Diagnosis not present

## 2019-07-26 DIAGNOSIS — K2950 Unspecified chronic gastritis without bleeding: Secondary | ICD-10-CM

## 2019-07-26 DIAGNOSIS — K219 Gastro-esophageal reflux disease without esophagitis: Secondary | ICD-10-CM | POA: Diagnosis not present

## 2019-07-26 DIAGNOSIS — R0789 Other chest pain: Secondary | ICD-10-CM

## 2019-07-26 DIAGNOSIS — K2990 Gastroduodenitis, unspecified, without bleeding: Secondary | ICD-10-CM

## 2019-07-26 DIAGNOSIS — K6389 Other specified diseases of intestine: Secondary | ICD-10-CM | POA: Diagnosis not present

## 2019-07-26 MED ORDER — SODIUM CHLORIDE 0.9 % IV SOLN: 500.0000 mL | Freq: Once | INTRAVENOUS | Status: DC

## 2019-07-26 NOTE — Progress Notes (Signed)
Your may notice some irritation in your nose or some drainage.  This may cause feelings of congestion.  This is from the oxygen, which can be very drying.  There is no need for concern, this should clear up in a day or so 

## 2019-07-26 NOTE — Progress Notes (Signed)
Called to room to assist during endoscopic procedure.  Patient ID and intended procedure confirmed with present staff. Received instructions for my participation in the procedure from the performing physician.  

## 2019-07-26 NOTE — Op Note (Signed)
[facility] Patient Name: [name] Procedure Date: 07/26/2019 9:54 AM MRN: [id number] Endoscopist: Beverley Fiedler , MD Age: 34 Referring MD:  Date of Birth: 06/18/1985 Gender: Male Account #: [id number] Procedure:                Upper GI endoscopy Indications:              Positive celiac serologies (low positive TTG                            antibody), history of atypical chest pain, family                            history of eosinophilic esophagitis in brother Medicines:                Monitored Anesthesia Care Procedure:                Pre-Anesthesia Assessment:                           - Prior to the procedure, a History and Physical                            was performed, and patient medications and                            allergies were reviewed. The patient's tolerance of                            previous anesthesia was also reviewed. The risks                            and benefits of the procedure and the sedation                            options and risks were discussed with the patient.                            All questions were answered, and informed consent                            was obtained. Prior Anticoagulants: The patient has                            taken no previous anticoagulant or antiplatelet                            agents. ASA Grade Assessment: II - A patient with                            mild systemic disease. After reviewing the risks                            and benefits, the patient was deemed in  satisfactory condition to undergo the procedure.                           After obtaining informed consent, the endoscope was                            passed under direct vision. Throughout the                            procedure, the patient's blood pressure, pulse, and                            oxygen saturations were monitored continuously. The                            Endoscope was  introduced through the mouth, and                            advanced to the second part of duodenum. The upper                            GI endoscopy was accomplished without difficulty.                            The patient tolerated the procedure well. Scope In: Scope Out: Findings:                 The examined esophagus was normal. Multiple                            biopsies were obtained with cold forceps for                            evaluation of eosinophilic esophagitis in the                            proximal esophagus and in the distal esophagus.                           Patchy mildly erythematous mucosa was found in the                            gastric antrum. Biopsies were taken with a cold                            forceps for histology and Helicobacter pylori                            testing.                           The exam of the stomach was otherwise normal.                           Diffuse moderately scalloped mucosa was found  in                            the duodenal bulb. Biopsies for histology were                            taken with a cold forceps for evaluation of celiac                            disease.                           The second portion of the duodenum appeared more                            normal. Biopsies for histology were taken with a                            cold forceps for evaluation of celiac disease. Complications:            No immediate complications. Estimated Blood Loss:     Estimated blood loss was minimal. Impression:               - Normal esophagus.                           - Erythematous mucosa in the antrum. Biopsied.                           - Scalloped mucosa was found in the duodenum,                            suspicious for celiac disease. Biopsied.                           - Normal second portion of the duodenum. Biopsied.                           - Multiple biopsies were obtained in the proximal                             esophagus and in the distal esophagus. Recommendation:           - Patient has a contact number available for                            emergencies. The signs and symptoms of potential                            delayed complications were discussed with the                            patient. Return to normal activities tomorrow.                            Written discharge instructions were provided to  the                            patient.                           - Resume previous diet.                           - Continue present medications.                           - Await pathology results. Beverley Fiedler, MD 07/26/2019 10:15:18 AM This report has been signed electronically.

## 2019-07-26 NOTE — Patient Instructions (Signed)
Handout on gastritis given.    YOU HAD AN ENDOSCOPIC PROCEDURE TODAY AT THE [facility]:   Refer to the procedure report that was given to you for any specific questions about what was found during the examination.  If the procedure report does not answer your questions, please call your gastroenterologist to clarify.  If you requested that your care partner not be given the details of your procedure findings, then the procedure report has been included in a sealed envelope for you to review at your convenience later.  YOU SHOULD EXPECT: Some feelings of bloating in the abdomen. Passage of more gas than usual.  Walking can help get rid of the air that was put into your GI tract during the procedure and reduce the bloating. If you had a lower endoscopy (such as a colonoscopy or flexible sigmoidoscopy) you may notice spotting of blood in your stool or on the toilet paper. If you underwent a bowel prep for your procedure, you may not have a normal bowel movement for a few days.  Please Note:  You might notice some irritation and congestion in your nose or some drainage.  This is from the oxygen used during your procedure.  There is no need for concern and it should clear up in a day or so.  SYMPTOMS TO REPORT IMMEDIATELY:    Following upper endoscopy (EGD)  Vomiting of blood or coffee ground material  New chest pain or pain under the shoulder blades  Painful or persistently difficult swallowing  New shortness of breath  Fever of 100F or higher  Black, tarry-looking stools  For urgent or emergent issues, a gastroenterologist can be reached at any hour by calling [phone]. Do not use MyChart messaging for urgent concerns.    DIET:  We do recommend a small meal at first, but then you may proceed to your regular diet.  Drink plenty of fluids but you should avoid alcoholic beverages for 24 hours.  ACTIVITY:  You should plan to take it easy for the rest of today and you should  NOT DRIVE or use heavy machinery until tomorrow (because of the sedation medicines used during the test).    FOLLOW UP: Our staff will call the number listed on your records 48-72 hours following your procedure to check on you and address any questions or concerns that you may have regarding the information given to you following your procedure. If we do not reach you, we will leave a message.  We will attempt to reach you two times.  During this call, we will ask if you have developed any symptoms of COVID 19. If you develop any symptoms (ie: fever, flu-like symptoms, shortness of breath, cough etc.) before then, please call [phone].  If you test positive for Covid 19 in the 2 weeks post procedure, please call and report this information to us.    If any biopsies were taken you will be contacted by phone or by letter within the next 1-3 weeks.  Please call us at [phone] if you have not heard about the biopsies in 3 weeks.    SIGNATURES/CONFIDENTIALITY: You and/or your care partner have signed paperwork which will be entered into your electronic medical record.  These signatures attest to the fact that that the information above on your After Visit Summary has been reviewed and is understood.  Full responsibility of the confidentiality of this discharge information lies with you and/or your care-partner. 

## 2019-07-26 NOTE — Progress Notes (Signed)
Patient states insulin pump on continuous mode.

## 2019-07-26 NOTE — Progress Notes (Signed)
To PACU, VSS. Report to [email or web address] 

## 2019-07-30 ENCOUNTER — Telehealth: Payer: Self-pay | Admitting: *Deleted

## 2019-07-30 ENCOUNTER — Telehealth: Payer: Self-pay

## 2019-07-30 ENCOUNTER — Telehealth: Payer: 59 | Admitting: Cardiology

## 2019-07-30 DIAGNOSIS — E039 Hypothyroidism, unspecified: Secondary | ICD-10-CM

## 2019-07-30 DIAGNOSIS — R079 Chest pain, unspecified: Secondary | ICD-10-CM

## 2019-07-30 DIAGNOSIS — E109 Type 1 diabetes mellitus without complications: Secondary | ICD-10-CM | POA: Insufficient documentation

## 2019-07-30 NOTE — Telephone Encounter (Signed)
Attempt to call patient vm full

## 2019-07-30 NOTE — Telephone Encounter (Signed)
Second attempt, mailbox is full and unable to leave message.  

## 2019-07-30 NOTE — Telephone Encounter (Signed)
Mailbox full

## 2019-07-31 ENCOUNTER — Encounter: Payer: Self-pay | Admitting: Internal Medicine

## 2019-07-31 DIAGNOSIS — K900 Celiac disease: Secondary | ICD-10-CM | POA: Insufficient documentation

## 2019-08-16 ENCOUNTER — Other Ambulatory Visit (HOSPITAL_COMMUNITY): Payer: Self-pay | Admitting: Endocrinology

## 2019-08-20 DIAGNOSIS — Z1389 Encounter for screening for other disorder: Secondary | ICD-10-CM | POA: Diagnosis not present

## 2019-08-20 DIAGNOSIS — E310 Autoimmune polyglandular failure: Secondary | ICD-10-CM | POA: Diagnosis not present

## 2019-08-20 DIAGNOSIS — K2950 Unspecified chronic gastritis without bleeding: Secondary | ICD-10-CM | POA: Diagnosis not present

## 2019-08-20 DIAGNOSIS — R0902 Hypoxemia: Secondary | ICD-10-CM | POA: Diagnosis not present

## 2019-08-20 DIAGNOSIS — E538 Deficiency of other specified B group vitamins: Secondary | ICD-10-CM | POA: Diagnosis not present

## 2019-08-20 DIAGNOSIS — E1040 Type 1 diabetes mellitus with diabetic neuropathy, unspecified: Secondary | ICD-10-CM | POA: Diagnosis not present

## 2019-08-20 DIAGNOSIS — Z0000 Encounter for general adult medical examination without abnormal findings: Secondary | ICD-10-CM | POA: Diagnosis not present

## 2019-08-20 DIAGNOSIS — Z794 Long term (current) use of insulin: Secondary | ICD-10-CM | POA: Diagnosis not present

## 2019-08-20 DIAGNOSIS — G4734 Idiopathic sleep related nonobstructive alveolar hypoventilation: Secondary | ICD-10-CM | POA: Diagnosis not present

## 2019-08-20 DIAGNOSIS — K900 Celiac disease: Secondary | ICD-10-CM | POA: Diagnosis not present

## 2019-08-20 MED FILL — KETOCONAZOLE 2% CREAM: 2 | 7 days supply | Qty: 15 | Fill #0

## 2019-08-22 ENCOUNTER — Encounter: Payer: Self-pay | Admitting: *Deleted

## 2019-08-29 MED FILL — HumaLOG 100 UNIT/ML SOLN: 100 | 40 days supply | Qty: 40 | Fill #0

## 2019-08-29 MED FILL — KETOCONAZOLE 2% CREAM: 2 | 7 days supply | Qty: 15 | Fill #0

## 2019-09-03 DIAGNOSIS — E1065 Type 1 diabetes mellitus with hyperglycemia: Secondary | ICD-10-CM | POA: Diagnosis not present

## 2019-09-03 DIAGNOSIS — E1040 Type 1 diabetes mellitus with diabetic neuropathy, unspecified: Secondary | ICD-10-CM | POA: Diagnosis not present

## 2019-09-03 DIAGNOSIS — E109 Type 1 diabetes mellitus without complications: Secondary | ICD-10-CM | POA: Diagnosis not present

## 2019-09-03 DIAGNOSIS — Z794 Long term (current) use of insulin: Secondary | ICD-10-CM | POA: Diagnosis not present

## 2019-09-04 ENCOUNTER — Ambulatory Visit: Payer: 59 | Admitting: Neurology

## 2019-09-04 ENCOUNTER — Encounter: Payer: Self-pay | Admitting: Neurology

## 2019-09-04 ENCOUNTER — Other Ambulatory Visit: Payer: Self-pay

## 2019-09-04 VITALS — BP 118/73 | HR 80 | Ht 67.0 in | Wt 185.0 lb

## 2019-09-04 DIAGNOSIS — R0683 Snoring: Secondary | ICD-10-CM

## 2019-09-04 DIAGNOSIS — R5383 Other fatigue: Secondary | ICD-10-CM | POA: Diagnosis not present

## 2019-09-04 DIAGNOSIS — R7981 Abnormal blood-gas level: Secondary | ICD-10-CM

## 2019-09-04 DIAGNOSIS — G4734 Idiopathic sleep related nonobstructive alveolar hypoventilation: Secondary | ICD-10-CM | POA: Diagnosis not present

## 2019-09-04 DIAGNOSIS — E663 Overweight: Secondary | ICD-10-CM | POA: Diagnosis not present

## 2019-09-04 NOTE — Patient Instructions (Signed)
Thank you for choosing [facility] for your sleep related care! It was nice to meet you today! I appreciate that you entrust me with your sleep related healthcare concerns. I hope, I was able to address at least some of your concerns today, and that I can help you feel reassured and also get better.    Here is what we discussed today and what we came up with as our plan for you:    Based on your symptoms and your exam I believe you may be at some risk for obstructive sleep apnea (aka OSA), and I think we should proceed with a sleep study to determine whether you do or do not have OSA and how severe it is. Even, if you have mild OSA, I may want you to consider treatment with CPAP, as treatment of even borderline or mild sleep apnea can result and improvement of symptoms such as sleep disruption, daytime sleepiness, nighttime bathroom breaks, restless leg symptoms, improvement of headache syndromes, even improved mood disorder.   Please remember, the long-term risks and ramifications of untreated moderate to severe obstructive sleep apnea are: increased Cardiovascular disease, including congestive heart failure, stroke, difficult to control hypertension, treatment resistant obesity, arrhythmias, especially irregular heartbeat commonly known as A. Fib. (atrial fibrillation); even type 2 diabetes has been linked to untreated OSA.   Sleep apnea can cause disruption of sleep and sleep deprivation in most cases, which, in turn, can cause recurrent headaches, problems with memory, mood, concentration, focus, and vigilance. Most people with untreated sleep apnea report excessive daytime sleepiness, which can affect their ability to drive. Please do not drive if you feel sleepy. Patients with sleep apnea developed difficulty initiating and maintaining sleep (aka insomnia).   Having sleep apnea may increase your risk for other sleep disorders, including involuntary behaviors sleep such as sleep  terrors, sleep talking, sleepwalking.    Having sleep apnea can also increase your risk for restless leg syndrome and leg movements at night.   Please note that untreated obstructive sleep apnea may carry additional perioperative morbidity. Patients with significant obstructive sleep apnea (typically, in the moderate to severe degree) should receive, if possible, perioperative PAP (positive airway pressure) therapy and the surgeons and particularly the anesthesiologists should be informed of the diagnosis and the severity of the sleep disordered breathing.   I will likely see you back after your sleep study to go over the test results and where to go from there. We will call you after your sleep study to advise about the results (most likely, you will hear from Kristen, my nurse) and to set up an appointment at the time, as necessary.    Our sleep lab administrative assistant will call you to schedule your sleep study and give you further instructions, regarding the check in process for the sleep study, arrival time, what to bring, when you can expect to leave after the study, etc., and to answer any other logistical questions you may have. If you don't hear back from her by about 2 weeks from now, please feel free to call her direct line at [phone] or you can call our general clinic number, or email us through My Chart.   

## 2019-09-04 NOTE — Progress Notes (Signed)
Subjective:    Foley ID: [name] is a 34 y.o. male.  HPI     Jacob Age, MD, [name] [facility] [address] [place], [place] [address]  Dear Dr. Forde Dandy,  I saw your Foley, [name], upon your kind request in my sleep clinic today for initial consultation of his sleep disorder, in particular, concern for underlying obstructive sleep apnea.  [name] is unaccompanied today.  As you know, [name] is a 34 year old right-handed gentleman with an underlying medical history of type 1 diabetes, history of seizure, carpal tunnel syndrome, B12 deficiency, gastritis, hypothyroidism, hyperlipidemia, anxiety, and overweight state, who reports snoring and feeling tired in Jacob afternoon. I reviewed Jacob office note from 08/20/2019.  He had a overnight pulse oximetry test through your office and I reviewed Jacob results.  Test date was 06/27/2019.  Total test time was 8 hours and 27 minutes, average oxygen saturation 94.7%, nadir was 50% but this appears to be an error.  True nadir appeared to be 79%, time below are at 88% saturation was 7.4 minutes but this included Jacob erroneous time. He does recall Jacob Jacob sensor had come off at night.  His snoring is intermittent and he reports that it is more noticeable when he sleeps on his back which is rare.  He prefers to sleep on his stomach.  He lives with his wife and 2 children, ages 72 and 110 months old, Jacob baby sleeps in Jacob bed with them.  He also has 2 bulldogs in Jacob household, they sleep in their crate at night.  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  He goes to bed generally between 11 and midnight and rise time is between 7 and 8.  He does not have night to night nocturia or recurrent morning headaches and does not have family history of sleep apnea. He smokes cigarettes very occasionally, not daily, does not drink any alcohol typically, and does drink quite a bit of caffeine in Jacob  form of diet soda, 16 or 20 ounce bottles, 6 to 7/day on average.  His Epworth sleepiness score is 6 out of 24, fatigue severity score is 18 out of 63.  His Past Medical History Is Significant For: Past Medical History:  Diagnosis Date  . Anxiety   . Atypical chest pain 04/2017   w/SOB. Pain radiates down left arm at times. Fam Hx of heart disease  . Celiac disease   . CTS (carpal tunnel syndrome) 2009   (R)  . Diabetes mellitus 05/1997   type I   . DKA (diabetic ketoacidoses) ([place]) 2003 & 2004  . GERD (gastroesophageal reflux disease)   . Hyperlipidemia   . Hypothyroidism   . Pilonidal cyst 2008  . Seizure ([place]) 2002   hypoglycemic  . Seizures ([place]) 1990, 1991   febrile seizures x 3    His Past Surgical History Is Significant For: Past Surgical History:  Procedure Laterality Date  . none    . TYMPANOSTOMY TUBE PLACEMENT  1990    His Family History Is Significant For: Family History  Problem Relation Foley of Onset  . Heart disease Other   . Hypertension Maternal Grandfather   . High Cholesterol Maternal Grandfather   . Hypertension Paternal Grandfather   . High Cholesterol Paternal Grandfather   . Celiac disease Mother   . Irritable bowel syndrome Father   . Colon cancer Neg Hx   . Colon polyps  Neg Hx   . Esophageal cancer Neg Hx   . Rectal cancer Neg Hx   . Stomach cancer Neg Hx     His Social History Is Significant For: Social History   Socioeconomic History  . Marital status: Married    Spouse name: Not on file  . Number of children: 1  . Years of education: Not on file  . Highest education level: Not on file  Occupational History  . Not on file  Tobacco Use  . Smoking status: Former Smoker    Packs/day: 1.00    Years: 10.00    Pack years: 10.00    Types: Cigarettes  . Smokeless tobacco: Never Used  Substance and Sexual Activity  . Alcohol use: Yes    Comment: occasional  . Drug use: No  . Sexual activity: Not on file  Other Topics Concern  .  Not on file  Social History Narrative  . Not on file   Social Determinants of Health   Financial Resource Strain:   . Difficulty of Paying Living Expenses:   Food Insecurity:   . Worried About Charity fundraiser in Jacob Last Year:   . Arboriculturist in [place] Last Year:   Transportation Needs:   . Film/video editor (Medical):   Marland Kitchen Lack of Transportation (Non-Medical):   Physical Activity:   . Days of Exercise per Week:   . Minutes of Exercise per Session:   Stress:   . Feeling of Stress :   Social Connections:   . Frequency of Communication with Friends and Family:   . Frequency of Social Gatherings with Friends and Family:   . Attends Religious Services:   . Active Member of Clubs or Organizations:   . Attends Archivist Meetings:   Marland Kitchen Marital Status:     His Allergies Are:  Allergies  Allergen Reactions  . Codeine   . Prednisone     Extreme hyperglycemia  :   His Current Medications Are:  Outpatient Encounter Medications as of 09/04/2019  Medication Sig  . buPROPion (WELLBUTRIN) 75 MG tablet Take 75 mg by mouth at bedtime.  . DULoxetine (CYMBALTA) 60 MG capsule Take 60 mg by mouth daily.  Marland Kitchen HUMALOG 100 UNIT/ML injection USE IN OMNIPOD DAILY (CHO 10, ISF 35, TARGET 120) USE 100U MAX DAILY DOSE OR INCREASE AS DIRECTED BY MD.  . Insulin Disposable Pump (OMNIPOD DASH SYSTEM) KIT by Does not apply route.  Marland Kitchen levothyroxine (SYNTHROID, LEVOTHROID) 100 MCG tablet Take 100 mcg by mouth daily before breakfast.  . omeprazole (PRILOSEC) 40 MG capsule Take 40 mg by mouth daily.  . rosuvastatin (CRESTOR) 5 MG tablet Take 5 mg by mouth daily.   No facility-administered encounter medications on file as of 09/04/2019.  :  Review of Systems:  Out of a complete 14 point review of systems, all are reviewed and negative with Jacob exception of these symptoms as listed below: Review of Systems  Neurological:       Here for sleep consult. No prior sleep study.Marland Kitchen ONO was completed  on 06/27/2019. Pt reports snoring is present when he is sleeping on his back. Epworth Sleepiness Scale 0= would never doze 1= slight chance of dozing 2= moderate chance of dozing 3= high chance of dozing  Sitting and reading:2 Watching TV:0 Sitting inactive in a public place (ex. Theater or meeting):0 As a passenger in a car for an hour without a break:0 Lying down to rest in Jacob afternoon:3  Sitting and talking to someone:0 Sitting quietly after lunch (no alcohol):1 In a car, while stopped in traffic:0 Total:6      Objective:  Neurological Exam  Physical Exam Physical Examination:   Vitals:   09/04/19 1111  BP: 118/73  Pulse: 80    General Examination: [name] is a very pleasant 34 y.o. male in no acute distress. He appears well-developed and well-nourished and well groomed.   HEENT: Normocephalic, atraumatic, pupils are equal, round and reactive to light, extraocular tracking is good without limitation to gaze excursion or nystagmus noted. Hearing is grossly intact. Face is symmetric with normal facial animation. Speech is clear with no dysarthria noted. There is no hypophonia. There is no lip, neck/head, jaw or voice tremor. Neck is supple with full range of passive and active motion. There are no carotid bruits on auscultation. Oropharynx exam reveals: mild mouth dryness, good dental hygiene and mild airway crowding, due to smaller airway entry. Mallampati is class II. Tongue protrudes centrally and palate elevates symmetrically. Tonsils are small. Neck size of 16 in, minimal overbite.   Chest: Clear to auscultation without wheezing, rhonchi or crackles noted.  Heart: S1+S2+0, regular and normal without murmurs, rubs or gallops noted.   Abdomen: Soft, non-tender and non-distended with normal bowel sounds appreciated on auscultation.  Skin: Warm and dry without trophic changes noted.   Musculoskeletal: exam reveals no obvious joint deformities, tenderness or joint  swelling or erythema.   Neurologically:  Mental status: [name] is awake, alert and oriented in all 4 spheres. His immediate and remote memory, attention, language skills and fund of knowledge are appropriate. There is no evidence of aphasia, agnosia, apraxia or anomia. Speech is clear with normal prosody and enunciation. Thought process is linear. Mood is normal and affect is normal.  Cranial nerves II - XII are as described above under HEENT exam.  Motor exam: Normal bulk, strength and tone is noted. There is no tremor, Romberg is negative. Reflexes are 2+ throughout. Fine motor skills and coordination: grossly intact.  Cerebellar testing: No dysmetria or intention tremor. There is no truncal or gait ataxia.  Sensory exam: intact to light touch in Jacob upper and lower extremities.  Gait, station and balance: He stands easily. No veering to one side is noted. No leaning to one side is noted. Posture is Foley-appropriate and stance is narrow based. Gait shows normal stride length and normal pace. No problems turning are noted. Tandem walk is unremarkable.                Assessment and Plan:  In summary, [name] is a very pleasant 34 y.o.-year old male with an underlying medical history of type 1 diabetes, history of seizure, carpal tunnel syndrome, B12 deficiency, gastritis, hypothyroidism, hyperlipidemia, anxiety, and overweight state, whose history and physical exam are concerning for obstructive sleep apnea (OSA). I had a long chat with [name] about my findings and Jacob diagnosis of OSA, its prognosis and treatment options. We talked about medical treatments, surgical interventions and non-pharmacological approaches. I explained in particular Jacob risks and ramifications of untreated moderate to severe OSA, especially with respect to developing cardiovascular disease down [place] [address], including congestive heart failure, difficult to treat hypertension, cardiac arrhythmias, or stroke. Even  type 2 diabetes has, in part, been linked to untreated OSA. Symptoms of untreated OSA include daytime sleepiness, memory problems, mood irritability and mood disorder such as depression and anxiety, lack of energy, as well as recurrent headaches, especially  morning headaches. We talked about trying to maintain a healthy lifestyle in general, as well as Jacob importance of weight control. We also talked about Jacob importance of good sleep hygiene. I recommended Jacob following at this time: sleep study.   I explained Jacob sleep test procedure to [name] and also outlined possible surgical and non-surgical treatment options of OSA, including Jacob use of a custom-made dental device (which would require a referral to a specialist dentist or oral surgeon), or CPAP. He indicated that he would be willing to try CPAP if Jacob need arises. I explained Jacob importance of being compliant with PAP treatment, not only for insurance purposes but primarily to improve His symptoms, and for [name]'s long term health benefit, including to reduce His cardiovascular risks. I answered all his questions today and [name] was in agreement. I plan to see him back after Jacob sleep study is completed and encouraged him to call with any interim questions, concerns, problems or updates.   Thank you very much for allowing me to participate in Jacob care of this nice Foley. If I can be of any further assistance to you please do not hesitate to call me at [phone].  Sincerely,   Jacob Age, MD, [name]

## 2019-09-10 ENCOUNTER — Ambulatory Visit: Payer: 59 | Admitting: Internal Medicine

## 2019-09-16 MED FILL — LEVOTHYROXINE 100 MCG TABLE: 100 | 90 days supply | Qty: 90 | Fill #1

## 2019-09-16 MED FILL — OMEPRAZOLE 40 MG CPDR: 40 | 90 days supply | Qty: 90 | Fill #1

## 2019-10-03 DIAGNOSIS — E1040 Type 1 diabetes mellitus with diabetic neuropathy, unspecified: Secondary | ICD-10-CM | POA: Diagnosis not present

## 2019-10-03 DIAGNOSIS — E109 Type 1 diabetes mellitus without complications: Secondary | ICD-10-CM | POA: Diagnosis not present

## 2019-10-03 DIAGNOSIS — E1065 Type 1 diabetes mellitus with hyperglycemia: Secondary | ICD-10-CM | POA: Diagnosis not present

## 2019-10-03 DIAGNOSIS — Z794 Long term (current) use of insulin: Secondary | ICD-10-CM | POA: Diagnosis not present

## 2019-10-04 DIAGNOSIS — H52203 Unspecified astigmatism, bilateral: Secondary | ICD-10-CM | POA: Diagnosis not present

## 2019-10-04 DIAGNOSIS — E119 Type 2 diabetes mellitus without complications: Secondary | ICD-10-CM | POA: Diagnosis not present

## 2019-10-04 DIAGNOSIS — H5203 Hypermetropia, bilateral: Secondary | ICD-10-CM | POA: Diagnosis not present

## 2019-10-21 ENCOUNTER — Ambulatory Visit (INDEPENDENT_AMBULATORY_CARE_PROVIDER_SITE_OTHER): Payer: 59 | Admitting: Neurology

## 2019-10-21 ENCOUNTER — Other Ambulatory Visit: Payer: Self-pay

## 2019-10-21 DIAGNOSIS — G4733 Obstructive sleep apnea (adult) (pediatric): Secondary | ICD-10-CM

## 2019-10-21 DIAGNOSIS — E663 Overweight: Secondary | ICD-10-CM

## 2019-10-21 DIAGNOSIS — R7981 Abnormal blood-gas level: Secondary | ICD-10-CM

## 2019-10-21 DIAGNOSIS — G4734 Idiopathic sleep related nonobstructive alveolar hypoventilation: Secondary | ICD-10-CM

## 2019-10-21 DIAGNOSIS — R0683 Snoring: Secondary | ICD-10-CM

## 2019-10-21 DIAGNOSIS — R5383 Other fatigue: Secondary | ICD-10-CM

## 2019-10-24 ENCOUNTER — Telehealth: Payer: Self-pay

## 2019-10-24 NOTE — Progress Notes (Signed)
Patient referred by Dr. Evlyn Kanner, seen by me on 09/04/19, HST on 10/21/19.    Please call and notify the patient that the recent home sleep test showed obstructive sleep apnea. OSA is overall mild, but worth treating to see if he feels better after treatment. To that end I recommend treatment for this in the form of autoPAP, which means, that we don't have to bring him in for a sleep study with CPAP, but will let him try an autoPAP machine at home, through a DME company (of his choice, or as per insurance requirement). The DME representative will educate him on how to use the machine, how to put the mask on, etc. I have placed an order in the chart. Please send referral, talk to patient, send report to referring MD. We will need a FU in sleep clinic for 10 weeks post-PAP set up, please arrange that with me or one of our NPs. Thanks,   [name], MD, [name] [facility])

## 2019-10-24 NOTE — Telephone Encounter (Signed)
-----   Message from [name], MD sent at 10/24/2019  8:05 AM EDT ----- Patient referred by Dr. Evlyn Kanner, seen by me on 09/04/19, HST on 10/21/19.    Please call and notify the patient that the recent home sleep test showed obstructive sleep apnea. OSA is overall mild, but worth treating to see if he feels better after treatment. To that end I recommend treatment for this in the form of autoPAP, which means, that we don't have to bring him in for a sleep study with CPAP, but will let him try an autoPAP machine at home, through a DME company (of his choice, or as per insurance requirement). The DME representative will educate him on how to use the machine, how to put the mask on, etc. I have placed an order in the chart. Please send referral, talk to patient, send report to referring MD. We will need a FU in sleep clinic for 10 weeks post-PAP set up, please arrange that with me or one of our NPs. Thanks,   [name], MD, [name] [facility])

## 2019-10-24 NOTE — Telephone Encounter (Signed)
I called pt. No answer, left a message asking pt to call me back.   

## 2019-10-24 NOTE — Procedures (Signed)
Patient Information     First Name: Jacob Last Name: Foley ID: [id number]  Birth Date: March 06, 1986 Age: 34 Gender: Male  Referring Provider: Adrian Prince, MD BMI: 29.1 (W=185 lb, H=5' 7'')  Neck Circ.:  16 '' Epworth:  6/24   Sleep Study Information    Study Date: 10/21/19 S/H/A Version: [id number] / 4.1.1528 / 57  History:    34 year old man with a history of type 1 diabetes, history of seizure, carpal tunnel syndrome, B12 deficiency, gastritis, hypothyroidism, hyperlipidemia, anxiety, and overweight state, who reports snoring and feeling tired in the afternoon.  Summary & Diagnosis:     Mild OSA  Recommendations:      This home sleep test demonstrates overall mild obstructive sleep apnea with a total AHI of 11.4/hour and O2 nadir of 85%. Given the patient's medical history and sleep related complaints, treatment with positive airway pressure is recommended. This can be achieved in the form of autoPAP trial/titration at home. A full night CPAP titration study will help with proper treatment settings and mask fitting, if needed. Alternative treatments may include weight loss along with avoidance of the supine sleep position, or an oral appliance - in appropriate candidates.   Please note that untreated obstructive sleep apnea may carry additional perioperative morbidity. Patients with significant obstructive sleep apnea should receive perioperative PAP therapy and the surgeons and particularly the anesthesiologist should be informed of the diagnosis and the severity of the sleep disordered breathing. The patient should be cautioned not to drive, work at heights, or operate dangerous or heavy equipment when tired or sleepy. Review and reiteration of good sleep hygiene measures should be pursued with any patient. Other causes of the patient's symptoms, including circadian rhythm disturbances, an underlying mood disorder, medication effect and/or an underlying medical problem cannot be ruled out  based on this test. Clinical correlation is recommended.   The patient and his referring provider will be notified of the test results. The patient will be seen in follow up in sleep clinic at [facility].  I certify that I have reviewed the raw data recording prior to the issuance of this report in accordance with the standards of the American Academy of Sleep Medicine (AASM).  [name], MD, [name] [facility]) Diplomat, ABPN (Neurology and Sleep)           Sleep Summary    Oxygen Saturation Statistics     Start Study Time: End Study Time: Total Recording Time:          10:49:50 PM 8:53:51 AM  10 h, 4 min  Total Sleep Time % REM of Sleep Time:  8 h, 4 min  20.2    Mean: 95 Minimum: 85 Maximum: 98  Mean of Desaturations Nadirs (%):   90  Oxygen Desaturation. %:   4-9 10-20 >20 Total  Events Number Total    45  3 93.8 6.3  0 0.0  48 100.0  Oxygen Saturation: <90 <=88 <85 <80 <70  Duration (minutes): Sleep % 1.8 0.4  1.1 0.0  0.2 0.0 0.0 0.0 0.0 0.0     Respiratory Indices      Total Events REM NREM All Night  pRDI:  95  pAHI:  83 ODI:  48  pAHIc:  24  % CSR: 0.0 4.3 3.1 1.9 1.9 15.6 13.8 8.0 3.7 13.1 11.4 6.6 3.3       Pulse Rate Statistics during Sleep (BPM)      Mean:  74 Minimum: 52  Maximum: 113    Indices are calculated using technically valid sleep time of 7 h, 15 min. pRDI/pAHI are calculated using oxi desaturations ? 3%  Body Position Statistics  Position Supine Prone Right Left Non-Supine  Sleep (min) 102.5 192.5 178.5 0.0 371.0  Sleep % 21.2 39.7 36.8 0.0 76.6  pRDI 39.8 7.5 8.5 N/A 8.0  pAHI 38.1 5.8 6.8 N/A 6.3  ODI 25.7 3.4 2.4 N/A 2.9     Snoring Statistics Snoring Level (dB) >40 >50 >60 >70 >80 >Threshold (45)  Sleep (min) 115.0 8.5 3.3 1.1 0.0 17.8  Sleep % 23.7 1.8 0.7 0.2 0.0 3.7    Mean: 41 dB Sleep Stages Chart                  pAHI=11.4                              Mild               Moderate                    Severe                                                 5              15                    30

## 2019-10-24 NOTE — Addendum Note (Signed)
Addended by: [name] on: 10/24/2019 08:05 AM   Modules accepted: Orders

## 2019-10-28 NOTE — Telephone Encounter (Signed)
I called pt. I advised pt that Dr. Frances Furbish reviewed their sleep study results and found that pt had mild osa. Dr. Frances Furbish recommends that pt start an autopap for treatment. I reviewed PAP compliance expectations with the pt. Pt is agreeable to starting an auto-PAP. I advised pt that an order will be sent to a DME, [facility], and [facility] will call the pt within about one week after they file with the pt's insurance. [facility] will show the pt how to use the machine, fit for masks, and troubleshoot the auto-PAP if needed. A follow up appt was made for insurance purposes with Dr. Frances Furbish on 01/02/2020 at 1030 am. Pt verbalized understanding to arrive 15 minutes early and bring their auto-PAP. A letter with all of this information in it will be mailed to the pt as a reminder. I verified with the pt that the address we have on file is correct. Pt verbalized understanding of results. Pt had no questions at this time but was encouraged to call back if questions arise. I have sent the order to [facility] and have received confirmation that they have received the order.

## 2019-10-28 NOTE — Telephone Encounter (Signed)
Pt returning phone call from [facility]and[facility]. Inform Pt nurse seeing Pts will send her a message.

## 2019-11-03 DIAGNOSIS — Z794 Long term (current) use of insulin: Secondary | ICD-10-CM | POA: Diagnosis not present

## 2019-11-03 DIAGNOSIS — E109 Type 1 diabetes mellitus without complications: Secondary | ICD-10-CM | POA: Diagnosis not present

## 2019-11-03 DIAGNOSIS — E1040 Type 1 diabetes mellitus with diabetic neuropathy, unspecified: Secondary | ICD-10-CM | POA: Diagnosis not present

## 2019-11-03 DIAGNOSIS — E1065 Type 1 diabetes mellitus with hyperglycemia: Secondary | ICD-10-CM | POA: Diagnosis not present

## 2019-11-11 MED FILL — DULoxetine HCL 60 MG CPEP: 60 | 90 days supply | Qty: 90 | Fill #1

## 2019-11-11 MED FILL — HumaLOG 100 UNIT/ML SOLN: 100 | 40 days supply | Qty: 40 | Fill #1

## 2019-11-11 MED FILL — ROSUVASTATIN CALCIUM 5 MG T: 5 | 90 days supply | Qty: 90 | Fill #2

## 2019-12-02 DIAGNOSIS — Z794 Long term (current) use of insulin: Secondary | ICD-10-CM | POA: Diagnosis not present

## 2019-12-02 DIAGNOSIS — E1065 Type 1 diabetes mellitus with hyperglycemia: Secondary | ICD-10-CM | POA: Diagnosis not present

## 2019-12-02 DIAGNOSIS — E1040 Type 1 diabetes mellitus with diabetic neuropathy, unspecified: Secondary | ICD-10-CM | POA: Diagnosis not present

## 2019-12-02 DIAGNOSIS — E109 Type 1 diabetes mellitus without complications: Secondary | ICD-10-CM | POA: Diagnosis not present

## 2019-12-05 DIAGNOSIS — G4733 Obstructive sleep apnea (adult) (pediatric): Secondary | ICD-10-CM | POA: Diagnosis not present

## 2020-01-01 ENCOUNTER — Telehealth: Payer: Self-pay | Admitting: Neurology

## 2020-01-01 MED FILL — LEVOTHYROXINE 100 MCG TABLE: 100 | 90 days supply | Qty: 90 | Fill #2

## 2020-01-01 MED FILL — HumaLOG 100 UNIT/ML SOLN: 100 | 40 days supply | Qty: 40 | Fill #2

## 2020-01-01 MED FILL — OMEPRAZOLE 40 MG CPDR: 40 | 90 days supply | Qty: 90 | Fill #2

## 2020-01-01 NOTE — Telephone Encounter (Signed)
Pt's wife called needing to speak to RN regarding pt's cpap. She states he has not been able to use the face mask he was given so she has ordered him a nose piece and they are waiting for it to come in so that can he start using the machine again. Wife Madden Garron on [place] would like to discuss.

## 2020-01-01 NOTE — Telephone Encounter (Signed)
I have reached out to [facility] about this to see what the hold up is for the pt getting his supplies.

## 2020-01-01 NOTE — Telephone Encounter (Signed)
I called the pt's wife in regards to this message and about initial autopap f/u scheduled for tomorrow with Dr. Frances Furbish. Pt has not been on his machine for the 31 minimum cut off. Pt has been rescheduled to November.  Pt ordered supplies through a 3rd party, not [facility]. I will ask [facility] to dis regard message.

## 2020-01-02 ENCOUNTER — Ambulatory Visit: Payer: Self-pay | Admitting: Neurology

## 2020-01-02 DIAGNOSIS — E1065 Type 1 diabetes mellitus with hyperglycemia: Secondary | ICD-10-CM | POA: Diagnosis not present

## 2020-01-02 DIAGNOSIS — E109 Type 1 diabetes mellitus without complications: Secondary | ICD-10-CM | POA: Diagnosis not present

## 2020-01-02 DIAGNOSIS — Z794 Long term (current) use of insulin: Secondary | ICD-10-CM | POA: Diagnosis not present

## 2020-01-02 DIAGNOSIS — E1040 Type 1 diabetes mellitus with diabetic neuropathy, unspecified: Secondary | ICD-10-CM | POA: Diagnosis not present

## 2020-01-04 DIAGNOSIS — G4733 Obstructive sleep apnea (adult) (pediatric): Secondary | ICD-10-CM | POA: Diagnosis not present

## 2020-01-30 DIAGNOSIS — E785 Hyperlipidemia, unspecified: Secondary | ICD-10-CM | POA: Diagnosis not present

## 2020-01-30 DIAGNOSIS — E1042 Type 1 diabetes mellitus with diabetic polyneuropathy: Secondary | ICD-10-CM | POA: Diagnosis not present

## 2020-01-30 DIAGNOSIS — Z4681 Encounter for fitting and adjustment of insulin pump: Secondary | ICD-10-CM | POA: Diagnosis not present

## 2020-01-30 DIAGNOSIS — G629 Polyneuropathy, unspecified: Secondary | ICD-10-CM | POA: Diagnosis not present

## 2020-01-30 DIAGNOSIS — G4730 Sleep apnea, unspecified: Secondary | ICD-10-CM | POA: Diagnosis not present

## 2020-01-30 DIAGNOSIS — Z794 Long term (current) use of insulin: Secondary | ICD-10-CM | POA: Diagnosis not present

## 2020-01-30 DIAGNOSIS — E310 Autoimmune polyglandular failure: Secondary | ICD-10-CM | POA: Diagnosis not present

## 2020-01-30 DIAGNOSIS — K219 Gastro-esophageal reflux disease without esophagitis: Secondary | ICD-10-CM | POA: Diagnosis not present

## 2020-01-30 DIAGNOSIS — K900 Celiac disease: Secondary | ICD-10-CM | POA: Diagnosis not present

## 2020-02-01 DIAGNOSIS — E1040 Type 1 diabetes mellitus with diabetic neuropathy, unspecified: Secondary | ICD-10-CM | POA: Diagnosis not present

## 2020-02-01 DIAGNOSIS — Z794 Long term (current) use of insulin: Secondary | ICD-10-CM | POA: Diagnosis not present

## 2020-02-01 DIAGNOSIS — E109 Type 1 diabetes mellitus without complications: Secondary | ICD-10-CM | POA: Diagnosis not present

## 2020-02-01 DIAGNOSIS — E1065 Type 1 diabetes mellitus with hyperglycemia: Secondary | ICD-10-CM | POA: Diagnosis not present

## 2020-02-04 DIAGNOSIS — G4733 Obstructive sleep apnea (adult) (pediatric): Secondary | ICD-10-CM | POA: Diagnosis not present

## 2020-02-06 ENCOUNTER — Ambulatory Visit: Payer: Self-pay | Admitting: Neurology

## 2020-02-20 MED FILL — HumaLOG 100 UNIT/ML SOLN: 100 | 40 days supply | Qty: 40 | Fill #3

## 2020-02-20 MED FILL — ROSUVASTATIN CALCIUM 5 MG T: 5 | 90 days supply | Qty: 90 | Fill #3

## 2020-02-20 MED FILL — DULoxetine HCL 60 MG CPEP: 60 | 90 days supply | Qty: 90 | Fill #2

## 2020-02-26 ENCOUNTER — Encounter: Payer: Self-pay | Admitting: Neurology

## 2020-02-26 ENCOUNTER — Ambulatory Visit: Payer: Self-pay | Admitting: Neurology

## 2020-02-26 ENCOUNTER — Telehealth: Payer: Self-pay

## 2020-02-26 NOTE — Telephone Encounter (Signed)
Pt did not show for their appt with Dr. Athar today.  

## 2020-03-03 DIAGNOSIS — Z794 Long term (current) use of insulin: Secondary | ICD-10-CM | POA: Diagnosis not present

## 2020-03-03 DIAGNOSIS — E1040 Type 1 diabetes mellitus with diabetic neuropathy, unspecified: Secondary | ICD-10-CM | POA: Diagnosis not present

## 2020-03-03 DIAGNOSIS — E1065 Type 1 diabetes mellitus with hyperglycemia: Secondary | ICD-10-CM | POA: Diagnosis not present

## 2020-03-03 DIAGNOSIS — E109 Type 1 diabetes mellitus without complications: Secondary | ICD-10-CM | POA: Diagnosis not present

## 2020-03-05 DIAGNOSIS — G4733 Obstructive sleep apnea (adult) (pediatric): Secondary | ICD-10-CM | POA: Diagnosis not present

## 2020-04-02 DIAGNOSIS — E1065 Type 1 diabetes mellitus with hyperglycemia: Secondary | ICD-10-CM | POA: Diagnosis not present

## 2020-04-02 DIAGNOSIS — Z794 Long term (current) use of insulin: Secondary | ICD-10-CM | POA: Diagnosis not present

## 2020-04-02 DIAGNOSIS — E1040 Type 1 diabetes mellitus with diabetic neuropathy, unspecified: Secondary | ICD-10-CM | POA: Diagnosis not present

## 2020-04-02 DIAGNOSIS — E109 Type 1 diabetes mellitus without complications: Secondary | ICD-10-CM | POA: Diagnosis not present

## 2020-04-15 ENCOUNTER — Other Ambulatory Visit (HOSPITAL_COMMUNITY): Payer: Self-pay | Admitting: Endocrinology

## 2020-04-15 MED FILL — OMEPRAZOLE 40 MG CPDR: 40 | 90 days supply | Qty: 90 | Fill #0

## 2020-04-15 MED FILL — LEVOTHYROXINE 100 MCG TABLE: 100 | 90 days supply | Qty: 90 | Fill #0

## 2020-05-03 DIAGNOSIS — Z794 Long term (current) use of insulin: Secondary | ICD-10-CM | POA: Diagnosis not present

## 2020-05-03 DIAGNOSIS — E1065 Type 1 diabetes mellitus with hyperglycemia: Secondary | ICD-10-CM | POA: Diagnosis not present

## 2020-05-03 DIAGNOSIS — E109 Type 1 diabetes mellitus without complications: Secondary | ICD-10-CM | POA: Diagnosis not present

## 2020-05-03 DIAGNOSIS — E1040 Type 1 diabetes mellitus with diabetic neuropathy, unspecified: Secondary | ICD-10-CM | POA: Diagnosis not present

## 2020-05-21 ENCOUNTER — Other Ambulatory Visit (HOSPITAL_COMMUNITY): Payer: Self-pay | Admitting: Endocrinology

## 2020-05-21 DIAGNOSIS — F17210 Nicotine dependence, cigarettes, uncomplicated: Secondary | ICD-10-CM | POA: Diagnosis not present

## 2020-05-21 DIAGNOSIS — E1042 Type 1 diabetes mellitus with diabetic polyneuropathy: Secondary | ICD-10-CM | POA: Diagnosis not present

## 2020-05-21 DIAGNOSIS — G4730 Sleep apnea, unspecified: Secondary | ICD-10-CM | POA: Diagnosis not present

## 2020-05-21 DIAGNOSIS — E310 Autoimmune polyglandular failure: Secondary | ICD-10-CM | POA: Diagnosis not present

## 2020-05-21 DIAGNOSIS — K900 Celiac disease: Secondary | ICD-10-CM | POA: Diagnosis not present

## 2020-05-21 DIAGNOSIS — E039 Hypothyroidism, unspecified: Secondary | ICD-10-CM | POA: Diagnosis not present

## 2020-05-21 DIAGNOSIS — G629 Polyneuropathy, unspecified: Secondary | ICD-10-CM | POA: Diagnosis not present

## 2020-05-21 DIAGNOSIS — E785 Hyperlipidemia, unspecified: Secondary | ICD-10-CM | POA: Diagnosis not present

## 2020-05-21 DIAGNOSIS — E538 Deficiency of other specified B group vitamins: Secondary | ICD-10-CM | POA: Diagnosis not present

## 2020-05-21 MED FILL — FREESTYLE LITE TEST STRIP: 88 days supply | Qty: 350 | Fill #0

## 2020-05-25 ENCOUNTER — Other Ambulatory Visit (HOSPITAL_COMMUNITY): Payer: Self-pay | Admitting: Endocrinology

## 2020-05-25 MED FILL — ROSUVASTATIN CALCIUM 5 MG T: 5 | 90 days supply | Qty: 90 | Fill #0

## 2020-05-25 MED FILL — DULoxetine HCL 60 MG CPEP: 60 | 90 days supply | Qty: 90 | Fill #0

## 2020-05-26 MED FILL — HumaLOG 100 UNIT/ML SOLN: 100 | 40 days supply | Qty: 40 | Fill #0

## 2020-06-10 DIAGNOSIS — E1042 Type 1 diabetes mellitus with diabetic polyneuropathy: Secondary | ICD-10-CM | POA: Diagnosis not present

## 2020-06-10 DIAGNOSIS — E039 Hypothyroidism, unspecified: Secondary | ICD-10-CM | POA: Diagnosis not present

## 2020-06-10 DIAGNOSIS — E538 Deficiency of other specified B group vitamins: Secondary | ICD-10-CM | POA: Diagnosis not present

## 2020-06-10 DIAGNOSIS — E785 Hyperlipidemia, unspecified: Secondary | ICD-10-CM | POA: Diagnosis not present

## 2020-06-12 DIAGNOSIS — G4733 Obstructive sleep apnea (adult) (pediatric): Secondary | ICD-10-CM | POA: Diagnosis not present

## 2020-07-21 ENCOUNTER — Other Ambulatory Visit (HOSPITAL_COMMUNITY): Payer: Self-pay

## 2020-07-21 MED FILL — Omeprazole Cap Delayed Release 40 MG: ORAL | 90 days supply | Qty: 90 | Fill #0 | Status: AC

## 2020-07-21 MED FILL — Insulin Lispro Subcutaneous Soln 100 Unit/ML: SUBCUTANEOUS | 40 days supply | Qty: 40 | Fill #0 | Status: AC

## 2020-07-21 MED FILL — Levothyroxine Sodium Tab 100 MCG: ORAL | 90 days supply | Qty: 90 | Fill #0 | Status: AC

## 2020-08-20 ENCOUNTER — Other Ambulatory Visit (HOSPITAL_COMMUNITY): Payer: Self-pay

## 2020-08-20 MED FILL — Duloxetine HCl Enteric Coated Pellets Cap 60 MG (Base Eq): ORAL | 90 days supply | Qty: 90 | Fill #0 | Status: AC

## 2020-08-20 MED FILL — Rosuvastatin Calcium Tab 5 MG: ORAL | 90 days supply | Qty: 90 | Fill #0 | Status: AC

## 2020-09-07 DIAGNOSIS — G4733 Obstructive sleep apnea (adult) (pediatric): Secondary | ICD-10-CM | POA: Diagnosis not present

## 2020-09-23 ENCOUNTER — Other Ambulatory Visit (HOSPITAL_COMMUNITY): Payer: Self-pay

## 2020-09-23 MED ORDER — INSULIN LISPRO 100 UNIT/ML IJ SOLN
INTRAMUSCULAR | 1 refills | Status: DC
Start: 2020-05-25 — End: 2020-11-27
  Filled 2020-09-23: qty 80, 80d supply, fill #0
  Filled 2020-10-08: qty 60, 60d supply, fill #0
  Filled 2020-11-27: qty 60, 60d supply, fill #1

## 2020-09-24 ENCOUNTER — Other Ambulatory Visit (HOSPITAL_COMMUNITY): Payer: Self-pay

## 2020-10-06 ENCOUNTER — Other Ambulatory Visit (HOSPITAL_COMMUNITY): Payer: Self-pay

## 2020-10-08 ENCOUNTER — Other Ambulatory Visit (HOSPITAL_COMMUNITY): Payer: Self-pay

## 2020-10-12 DIAGNOSIS — E1042 Type 1 diabetes mellitus with diabetic polyneuropathy: Secondary | ICD-10-CM | POA: Diagnosis not present

## 2020-10-20 ENCOUNTER — Other Ambulatory Visit (HOSPITAL_COMMUNITY): Payer: Self-pay

## 2020-10-21 ENCOUNTER — Other Ambulatory Visit (HOSPITAL_COMMUNITY): Payer: Self-pay

## 2020-10-21 MED FILL — Levothyroxine Sodium Tab 100 MCG: ORAL | 90 days supply | Qty: 90 | Fill #1 | Status: AC

## 2020-10-21 MED FILL — Omeprazole Cap Delayed Release 40 MG: ORAL | 90 days supply | Qty: 90 | Fill #1 | Status: AC

## 2020-11-12 ENCOUNTER — Other Ambulatory Visit (HOSPITAL_COMMUNITY): Payer: Self-pay

## 2020-11-12 MED ORDER — BUSPIRONE HCL 10 MG PO TABS
10.0000 mg | ORAL_TABLET | Freq: Two times a day (BID) | ORAL | 2 refills | Status: DC | PRN
  Filled 2020-11-12: qty 60, 30d supply, fill #0
  Filled 2021-01-04 – 2021-01-13 (×2): qty 60, 30d supply, fill #1
  Filled 2021-03-11: qty 60, 30d supply, fill #2

## 2020-11-27 ENCOUNTER — Other Ambulatory Visit (HOSPITAL_COMMUNITY): Payer: Self-pay

## 2020-11-27 MED ORDER — INSULIN LISPRO 100 UNIT/ML IJ SOLN
INTRAMUSCULAR | 1 refills | Status: DC
  Filled 2020-11-27: qty 40, 40d supply, fill #0
  Filled 2021-01-04 – 2021-01-13 (×2): qty 40, 40d supply, fill #1

## 2020-11-27 MED FILL — Rosuvastatin Calcium Tab 5 MG: ORAL | 90 days supply | Qty: 90 | Fill #1 | Status: AC

## 2020-11-27 MED FILL — Duloxetine HCl Enteric Coated Pellets Cap 60 MG (Base Eq): ORAL | 90 days supply | Qty: 90 | Fill #1 | Status: AC

## 2020-11-30 ENCOUNTER — Other Ambulatory Visit (HOSPITAL_COMMUNITY): Payer: Self-pay

## 2020-11-30 MED FILL — Glucose Blood Test Strip: 25 days supply | Qty: 100 | Fill #0 | Status: AC

## 2020-12-11 DIAGNOSIS — H5203 Hypermetropia, bilateral: Secondary | ICD-10-CM | POA: Diagnosis not present

## 2020-12-11 DIAGNOSIS — E1039 Type 1 diabetes mellitus with other diabetic ophthalmic complication: Secondary | ICD-10-CM | POA: Insufficient documentation

## 2020-12-11 DIAGNOSIS — H52203 Unspecified astigmatism, bilateral: Secondary | ICD-10-CM | POA: Diagnosis not present

## 2020-12-11 DIAGNOSIS — E1040 Type 1 diabetes mellitus with diabetic neuropathy, unspecified: Secondary | ICD-10-CM | POA: Diagnosis not present

## 2020-12-11 DIAGNOSIS — E109 Type 1 diabetes mellitus without complications: Secondary | ICD-10-CM | POA: Diagnosis not present

## 2020-12-11 DIAGNOSIS — E1065 Type 1 diabetes mellitus with hyperglycemia: Secondary | ICD-10-CM | POA: Diagnosis not present

## 2020-12-11 DIAGNOSIS — Z794 Long term (current) use of insulin: Secondary | ICD-10-CM | POA: Diagnosis not present

## 2020-12-23 ENCOUNTER — Other Ambulatory Visit (HOSPITAL_COMMUNITY): Payer: Self-pay

## 2021-01-04 ENCOUNTER — Other Ambulatory Visit (HOSPITAL_COMMUNITY): Payer: Self-pay

## 2021-01-04 MED FILL — Levothyroxine Sodium Tab 100 MCG: ORAL | 90 days supply | Qty: 90 | Fill #2 | Status: CN

## 2021-01-04 MED FILL — Omeprazole Cap Delayed Release 40 MG: ORAL | 90 days supply | Qty: 90 | Fill #2 | Status: CN

## 2021-01-10 DIAGNOSIS — E1040 Type 1 diabetes mellitus with diabetic neuropathy, unspecified: Secondary | ICD-10-CM | POA: Diagnosis not present

## 2021-01-10 DIAGNOSIS — E109 Type 1 diabetes mellitus without complications: Secondary | ICD-10-CM | POA: Diagnosis not present

## 2021-01-10 DIAGNOSIS — Z794 Long term (current) use of insulin: Secondary | ICD-10-CM | POA: Diagnosis not present

## 2021-01-10 DIAGNOSIS — E1065 Type 1 diabetes mellitus with hyperglycemia: Secondary | ICD-10-CM | POA: Diagnosis not present

## 2021-01-12 ENCOUNTER — Other Ambulatory Visit (HOSPITAL_COMMUNITY): Payer: Self-pay

## 2021-01-13 ENCOUNTER — Other Ambulatory Visit (HOSPITAL_COMMUNITY): Payer: Self-pay

## 2021-01-13 MED FILL — Omeprazole Cap Delayed Release 40 MG: ORAL | 90 days supply | Qty: 90 | Fill #2 | Status: AC

## 2021-01-13 MED FILL — Levothyroxine Sodium Tab 100 MCG: ORAL | 90 days supply | Qty: 90 | Fill #2 | Status: AC

## 2021-02-10 DIAGNOSIS — Z794 Long term (current) use of insulin: Secondary | ICD-10-CM | POA: Diagnosis not present

## 2021-02-10 DIAGNOSIS — E109 Type 1 diabetes mellitus without complications: Secondary | ICD-10-CM | POA: Diagnosis not present

## 2021-02-10 DIAGNOSIS — E1040 Type 1 diabetes mellitus with diabetic neuropathy, unspecified: Secondary | ICD-10-CM | POA: Diagnosis not present

## 2021-02-10 DIAGNOSIS — E1065 Type 1 diabetes mellitus with hyperglycemia: Secondary | ICD-10-CM | POA: Diagnosis not present

## 2021-02-11 DIAGNOSIS — Z794 Long term (current) use of insulin: Secondary | ICD-10-CM | POA: Diagnosis not present

## 2021-02-11 DIAGNOSIS — E1042 Type 1 diabetes mellitus with diabetic polyneuropathy: Secondary | ICD-10-CM | POA: Diagnosis not present

## 2021-02-11 DIAGNOSIS — Z4681 Encounter for fitting and adjustment of insulin pump: Secondary | ICD-10-CM | POA: Diagnosis not present

## 2021-02-11 DIAGNOSIS — E039 Hypothyroidism, unspecified: Secondary | ICD-10-CM | POA: Diagnosis not present

## 2021-02-11 DIAGNOSIS — E785 Hyperlipidemia, unspecified: Secondary | ICD-10-CM | POA: Diagnosis not present

## 2021-02-19 DIAGNOSIS — G4733 Obstructive sleep apnea (adult) (pediatric): Secondary | ICD-10-CM | POA: Diagnosis not present

## 2021-03-01 ENCOUNTER — Other Ambulatory Visit (HOSPITAL_COMMUNITY): Payer: Self-pay

## 2021-03-01 MED ORDER — INSULIN LISPRO 100 UNIT/ML IJ SOLN
100.0000 [IU] | Freq: Every day | INTRAMUSCULAR | 3 refills | Status: DC
  Filled 2021-03-01: qty 40, 40d supply, fill #0
  Filled 2021-04-22: qty 40, 40d supply, fill #1
  Filled 2021-06-16: qty 40, 40d supply, fill #2
  Filled 2021-07-19: qty 40, 40d supply, fill #3

## 2021-03-01 MED FILL — Rosuvastatin Calcium Tab 5 MG: ORAL | 90 days supply | Qty: 90 | Fill #2 | Status: AC

## 2021-03-02 ENCOUNTER — Other Ambulatory Visit (HOSPITAL_COMMUNITY): Payer: Self-pay

## 2021-03-05 ENCOUNTER — Other Ambulatory Visit (HOSPITAL_COMMUNITY): Payer: Self-pay

## 2021-03-05 MED ORDER — DULOXETINE HCL 60 MG PO CPEP
60.0000 mg | ORAL_CAPSULE | Freq: Every day | ORAL | 3 refills | Status: DC
  Filled 2021-03-05: qty 90, 90d supply, fill #0
  Filled 2021-05-19: qty 90, 90d supply, fill #1
  Filled 2021-09-03: qty 90, 90d supply, fill #2
  Filled 2021-11-23: qty 38, 38d supply, fill #3
  Filled 2021-11-23: qty 52, 52d supply, fill #3
  Filled 2021-12-02: qty 90, 90d supply, fill #3

## 2021-03-11 ENCOUNTER — Other Ambulatory Visit (HOSPITAL_COMMUNITY): Payer: Self-pay

## 2021-04-02 DIAGNOSIS — Z794 Long term (current) use of insulin: Secondary | ICD-10-CM | POA: Diagnosis not present

## 2021-04-02 DIAGNOSIS — E1040 Type 1 diabetes mellitus with diabetic neuropathy, unspecified: Secondary | ICD-10-CM | POA: Diagnosis not present

## 2021-04-02 DIAGNOSIS — E109 Type 1 diabetes mellitus without complications: Secondary | ICD-10-CM | POA: Diagnosis not present

## 2021-04-02 DIAGNOSIS — E1065 Type 1 diabetes mellitus with hyperglycemia: Secondary | ICD-10-CM | POA: Diagnosis not present

## 2021-04-22 ENCOUNTER — Other Ambulatory Visit (HOSPITAL_COMMUNITY): Payer: Self-pay

## 2021-04-22 MED ORDER — OMEPRAZOLE 40 MG PO CPDR
40.0000 mg | DELAYED_RELEASE_CAPSULE | Freq: Every day | ORAL | 3 refills | Status: DC
  Filled 2021-04-22: qty 90, 90d supply, fill #0
  Filled 2021-07-19: qty 90, 90d supply, fill #1
  Filled 2021-10-22: qty 90, 90d supply, fill #2
  Filled 2022-01-20: qty 90, 90d supply, fill #3

## 2021-04-22 MED ORDER — LEVOTHYROXINE SODIUM 100 MCG PO TABS
100.0000 ug | ORAL_TABLET | Freq: Every day | ORAL | 3 refills | Status: DC
  Filled 2021-04-22: qty 90, 90d supply, fill #0
  Filled 2021-07-19: qty 90, 90d supply, fill #1
  Filled 2021-10-22: qty 90, 90d supply, fill #2
  Filled 2022-01-20: qty 90, 90d supply, fill #3

## 2021-05-15 DIAGNOSIS — J0110 Acute frontal sinusitis, unspecified: Secondary | ICD-10-CM | POA: Insufficient documentation

## 2021-05-19 ENCOUNTER — Other Ambulatory Visit (HOSPITAL_COMMUNITY): Payer: Self-pay

## 2021-05-19 MED ORDER — BUSPIRONE HCL 10 MG PO TABS
10.0000 mg | ORAL_TABLET | Freq: Two times a day (BID) | ORAL | 2 refills | Status: DC | PRN
  Filled 2021-05-19: qty 60, 30d supply, fill #0
  Filled 2021-06-16: qty 60, 30d supply, fill #1
  Filled 2021-09-03: qty 60, 30d supply, fill #2

## 2021-05-21 ENCOUNTER — Other Ambulatory Visit (HOSPITAL_COMMUNITY): Payer: Self-pay

## 2021-05-24 ENCOUNTER — Other Ambulatory Visit (HOSPITAL_COMMUNITY): Payer: Self-pay

## 2021-05-26 ENCOUNTER — Other Ambulatory Visit (HOSPITAL_COMMUNITY): Payer: Self-pay

## 2021-05-28 ENCOUNTER — Other Ambulatory Visit (HOSPITAL_COMMUNITY): Payer: Self-pay

## 2021-06-01 DIAGNOSIS — Z794 Long term (current) use of insulin: Secondary | ICD-10-CM | POA: Diagnosis not present

## 2021-06-01 DIAGNOSIS — E1065 Type 1 diabetes mellitus with hyperglycemia: Secondary | ICD-10-CM | POA: Diagnosis not present

## 2021-06-01 DIAGNOSIS — E1040 Type 1 diabetes mellitus with diabetic neuropathy, unspecified: Secondary | ICD-10-CM | POA: Diagnosis not present

## 2021-06-01 DIAGNOSIS — E109 Type 1 diabetes mellitus without complications: Secondary | ICD-10-CM | POA: Diagnosis not present

## 2021-06-16 ENCOUNTER — Other Ambulatory Visit (HOSPITAL_COMMUNITY): Payer: Self-pay

## 2021-06-16 MED ORDER — ROSUVASTATIN CALCIUM 5 MG PO TABS
5.0000 mg | ORAL_TABLET | Freq: Every day | ORAL | 3 refills | Status: DC
  Filled 2021-06-16: qty 90, 90d supply, fill #0
  Filled 2021-09-03: qty 90, 90d supply, fill #1
  Filled 2021-11-23 – 2021-12-02 (×2): qty 90, 90d supply, fill #2
  Filled 2022-02-22: qty 90, 90d supply, fill #3

## 2021-07-13 DIAGNOSIS — G4733 Obstructive sleep apnea (adult) (pediatric): Secondary | ICD-10-CM | POA: Diagnosis not present

## 2021-07-18 ENCOUNTER — Other Ambulatory Visit: Payer: Self-pay

## 2021-07-18 ENCOUNTER — Encounter (HOSPITAL_COMMUNITY): Payer: Self-pay

## 2021-07-18 ENCOUNTER — Emergency Department (HOSPITAL_COMMUNITY)
Admission: EM | Admit: 2021-07-18 | Discharge: 2021-07-18 | Disposition: A | Discharge status: Home / Self Care | Payer: 59 | Attending: Emergency Medicine | Admitting: Emergency Medicine

## 2021-07-18 DIAGNOSIS — E1010 Type 1 diabetes mellitus with ketoacidosis without coma: Secondary | ICD-10-CM | POA: Diagnosis not present

## 2021-07-18 DIAGNOSIS — S50812A Abrasion of left forearm, initial encounter: Secondary | ICD-10-CM | POA: Insufficient documentation

## 2021-07-18 DIAGNOSIS — Z23 Encounter for immunization: Secondary | ICD-10-CM | POA: Diagnosis not present

## 2021-07-18 DIAGNOSIS — W311XXA Contact with metalworking machines, initial encounter: Secondary | ICD-10-CM | POA: Insufficient documentation

## 2021-07-18 DIAGNOSIS — Z79899 Other long term (current) drug therapy: Secondary | ICD-10-CM | POA: Insufficient documentation

## 2021-07-18 DIAGNOSIS — Y9389 Activity, other specified: Secondary | ICD-10-CM | POA: Insufficient documentation

## 2021-07-18 DIAGNOSIS — Z794 Long term (current) use of insulin: Secondary | ICD-10-CM | POA: Diagnosis not present

## 2021-07-18 DIAGNOSIS — S59912A Unspecified injury of left forearm, initial encounter: Secondary | ICD-10-CM | POA: Diagnosis present

## 2021-07-18 DIAGNOSIS — E039 Hypothyroidism, unspecified: Secondary | ICD-10-CM | POA: Insufficient documentation

## 2021-07-18 MED ORDER — TETANUS-DIPHTH-ACELL PERTUSSIS 5-2.5-18.5 LF-MCG/0.5 IM SUSY
0.5000 mL | PREFILLED_SYRINGE | Freq: Once | INTRAMUSCULAR | Status: AC
  Administered 2021-07-18: 0.5 mL via INTRAMUSCULAR
  Filled 2021-07-18: qty 0.5

## 2021-07-18 NOTE — ED Triage Notes (Signed)
[place] and grinder went up his left arm.  Needing tetanus shot.  Patient is a diabetic.  Grinder was rusty.  ?

## 2021-07-18 NOTE — ED Provider Notes (Signed)
?[facility] ?Provider Note ? ? ?CSN: [id number] ?Arrival date & time: 07/18/21  1659 ? ?  ? ?History ?Chief Complaint  ?Patient presents with  ? Laceration  ? ? ?[name] is a 36 y.o. male who presents the emergency department with wounds to the left forearm.  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  He sustained wounds to the left forearm and left upper arm.  Bleeding controlled prior to arrival.  Tetanus is not up-to-date.  No other injury. ? ? ?Laceration ? ?  ? ?Home Medications ?Prior to Admission medications   ?Medication Sig Start Date End Date Taking? Authorizing Provider  ?busPIRone (BUSPAR) 10 MG tablet Take 1 tablet (10 mg total) by mouth 2 (two) times daily as needed. ?Patient taking differently: Take 10 mg by mouth 2 (two) times daily as needed (Anxiety). 05/19/21  Yes   ?DULoxetine (CYMBALTA) 60 MG capsule Take 1 capsule (60 mg total) by mouth daily. 03/05/21  Yes   ?insulin aspart (NOVOLOG) 100 UNIT/ML injection Inject 200 Units into the skin See admin instructions. 200 units into pump   Yes [provider]  ?levothyroxine (SYNTHROID) 100 MCG tablet Take 1 tablet (100 mcg total) by mouth daily on an empty stomach 30 minutes before a meal 04/22/21  Yes   ?omeprazole (PRILOSEC) 40 MG capsule Take 1 capsule (40 mg total) by mouth daily. 04/22/21  Yes   ?rosuvastatin (CRESTOR) 5 MG tablet Take 1 tablet (5 mg total) by mouth daily. 06/16/21  Yes   ?buPROPion (WELLBUTRIN) 75 MG tablet Take 75 mg by mouth at bedtime. ?Patient not taking: Reported on 07/18/2021 05/30/19   [provider]  ?Insulin Disposable Pump (OMNIPOD DASH SYSTEM) KIT by Does not apply route.    [provider]  ?insulin lispro (HUMALOG) 100 UNIT/ML injection USE IN OMNIPOD DAILY (CHO 10, ISF 35, TARGET 120) USE 100 UNITS MAX DAILY DOSE OR INCREASE AS DIRECTED BY MD. ?Patient not taking:  Reported on 07/18/2021 03/01/21     ?   ? ?Allergies    ?Codeine and Prednisone   ? ?Review of Systems   ?Review of Systems  ?All other systems reviewed and are negative. ? ?Physical Exam ?Updated Vital Signs ?BP 123/84 (BP Location: Right Arm)   Pulse 83   Temp 98.3 ?F (36.8 ?C) (Oral)   Resp 17   Ht _0  (1.702 m)   Wt 83.9 kg   SpO2 100%   BMI 28.98 kg/m?  ?Physical Exam ?Vitals and nursing note reviewed.  ?Constitutional:   ?   Appearance: Normal appearance.  ?HENT:  ?   Head: Normocephalic and atraumatic.  ?Eyes:  ?   General:     ?   Right eye: No discharge.     ?   Left eye: No discharge.  ?   Conjunctiva/sclera: Conjunctivae normal.  ?Pulmonary:  ?   Effort: Pulmonary effort is normal.  ?Skin: ?   General: Skin is warm and dry.  ?   Findings: No rash.  ?   Comments: Deep abrasions to the left anterior forearm and left bicep.  ?Neurological:  ?   General: No focal deficit present.  ?   Mental Status: He is alert.  ?Psychiatric:     ?   Mood and Affect: Mood normal.     ?   Behavior: Behavior normal.  ? ? ?ED  Results / Procedures / Treatments   ?Labs ?(all labs ordered are listed, but only abnormal results are displayed) ?Labs Reviewed - No data to display ? ?EKG ?None ? ?Radiology ?No results found. ? ?Procedures ?Procedures  ? ? ?Medications Ordered in ED ?Medications  ?Tdap (BOOSTRIX) injection 0.5 mL (0.5 mLs Intramuscular Given 07/18/21 1734)  ? ? ?ED Course/ Medical Decision Making/ A&P ?  ?                        ?Medical Decision Making ?Risk ?Prescription drug management. ? ? ?This patient presents to the ED for concern of wound over the left arm, this involves an extensive number of treatment options, and is a complaint that carries with it a high risk of complications and morbidity.  The differential diagnosis includes abrasions to the left forearm or possibly contaminated with metal. ? ? ?Co morbidities that complicate the patient evaluation ? ?Past Medical History:  ?Diagnosis Date  ?  Anxiety   ? Atypical chest pain 04/2017  ? w/SOB. Pain radiates down left arm at times. Fam Hx of heart disease  ? Celiac disease   ? CTS (carpal tunnel syndrome) 2009  ? (R)  ? Diabetes mellitus 05/1997  ? type I   ? DKA (diabetic ketoacidoses) 2003 & 2004  ? GERD (gastroesophageal reflux disease)   ? Hyperlipidemia   ? Hypothyroidism   ? Pilonidal cyst 2008  ? Seizure [facility]) 2002  ? hypoglycemic  ? Seizures ([place]) 1990, 1991  ? febrile seizures x 3  ? ? ?Additional history obtained: ? ?Additional history obtained from nursing note ? ? ?Lab Tests: ? ?I Ordered, and personally interpreted labs.  The pertinent results include: None ? ? ?Imaging Studies ordered: ? ?None ? ? ?Cardiac Monitoring: ? ?The patient was maintained on a cardiac monitor.  I personally viewed and interpreted the cardiac monitored which showed an underlying rhythm of: Normal sinus rhythm ? ? ?Medicines ordered and prescription drug management: ? ?I ordered medication including tetanus for up-to-date on vaccination ? ?I have reviewed the patients home medicines and have made adjustments as needed ? ? ?Test Considered: ? ?N/A ? ? ?Critical Interventions: ? ?N/A ? ? ?Problem List / ED Course: ? ?Patient presents the emergency department with wounds to the left forearm and left upper arm.  These seem to be deep abrasions.  There is evidence of burn skin from the mechanism of injury.  I copiously irrigated each wound with a liter of saline.  I examined under bright light to look for metal shavings.  No evidence of foreign body.  Wounds were left open.  I instructed him to cover them with some Neosporin if he is going to be working in his shop. ? ? ?Reevaluation: ? ?After the interventions noted above, I reevaluated the patient and found that they have :improved ? ? ?Social Determinants of Health: ? ?Social Determinants of Health with Concerns  ? ?Tobacco Use: High Risk  ? Smoking Tobacco Use: Every Day  ? Smokeless Tobacco Use: Never  ? Passive  Exposure: Not on file  ?Financial Resource Strain: Not on file  ?Food Insecurity: Not on file  ?Transportation Needs: Not on file  ?Physical Activity: Not on file  ?Stress: Not on file  ?Social Connections: Not on file  ?Intimate Partner Violence: Not on file  ?Depression (PHQ2-9): Not on file  ?Alcohol Screen: Not on file  ?Housing: Not on file  ? ? ?Dispostion: ? ?After  consideration of the diagnostic results and the patients response to treatment, I feel that the patent would benefit from outpatient follow-up. ? ?Final Clinical Impression(s) / ED Diagnoses ?Final diagnoses:  ?Abrasion of left forearm, initial encounter  ? ? ?Rx / DC Orders ?ED Discharge Orders   ? ? None  ? ?  ? ? ?  ?Hendricks Limes, PA-C ?07/18/21 1755 ? ?  ?Davonna Belling, MD ?07/19/21 0010 ? ?

## 2021-07-18 NOTE — Discharge Instructions (Signed)
Please keep area clean and dry.  You can follow-up with your primary care provider for further evaluation.  Return to the emerged part for any worsening symptoms. ?

## 2021-07-19 ENCOUNTER — Other Ambulatory Visit (HOSPITAL_COMMUNITY): Payer: Self-pay

## 2021-07-27 DIAGNOSIS — Z794 Long term (current) use of insulin: Secondary | ICD-10-CM | POA: Diagnosis not present

## 2021-07-27 DIAGNOSIS — E1065 Type 1 diabetes mellitus with hyperglycemia: Secondary | ICD-10-CM | POA: Diagnosis not present

## 2021-07-27 DIAGNOSIS — E1040 Type 1 diabetes mellitus with diabetic neuropathy, unspecified: Secondary | ICD-10-CM | POA: Diagnosis not present

## 2021-07-27 DIAGNOSIS — E109 Type 1 diabetes mellitus without complications: Secondary | ICD-10-CM | POA: Diagnosis not present

## 2021-08-26 DIAGNOSIS — E1040 Type 1 diabetes mellitus with diabetic neuropathy, unspecified: Secondary | ICD-10-CM | POA: Diagnosis not present

## 2021-08-26 DIAGNOSIS — E109 Type 1 diabetes mellitus without complications: Secondary | ICD-10-CM | POA: Diagnosis not present

## 2021-08-26 DIAGNOSIS — E1065 Type 1 diabetes mellitus with hyperglycemia: Secondary | ICD-10-CM | POA: Diagnosis not present

## 2021-08-26 DIAGNOSIS — Z794 Long term (current) use of insulin: Secondary | ICD-10-CM | POA: Diagnosis not present

## 2021-08-28 ENCOUNTER — Ambulatory Visit (INDEPENDENT_AMBULATORY_CARE_PROVIDER_SITE_OTHER): Payer: 59

## 2021-08-28 ENCOUNTER — Ambulatory Visit
Admission: EM | Admit: 2021-08-28 | Discharge: 2021-08-28 | Disposition: A | Discharge status: Home / Self Care | Payer: 59 | Attending: Family Medicine | Admitting: Family Medicine

## 2021-08-28 ENCOUNTER — Other Ambulatory Visit: Payer: Self-pay

## 2021-08-28 ENCOUNTER — Encounter: Payer: Self-pay | Admitting: Emergency Medicine

## 2021-08-28 DIAGNOSIS — R0989 Other specified symptoms and signs involving the circulatory and respiratory systems: Secondary | ICD-10-CM

## 2021-08-28 DIAGNOSIS — J019 Acute sinusitis, unspecified: Secondary | ICD-10-CM

## 2021-08-28 DIAGNOSIS — J4521 Mild intermittent asthma with (acute) exacerbation: Secondary | ICD-10-CM | POA: Diagnosis not present

## 2021-08-28 DIAGNOSIS — R059 Cough, unspecified: Secondary | ICD-10-CM | POA: Diagnosis not present

## 2021-08-28 MED ORDER — DOXYCYCLINE HYCLATE 100 MG PO CAPS: 100.0000 mg | ORAL_CAPSULE | Freq: Two times a day (BID) | ORAL | 0 refills | Status: AC

## 2021-08-28 MED ORDER — ALBUTEROL SULFATE HFA 108 (90 BASE) MCG/ACT IN AERS: 1.0000 | INHALATION_SPRAY | RESPIRATORY_TRACT | 0 refills | Status: DC | PRN

## 2021-08-28 MED ORDER — PREDNISONE 20 MG PO TABS: 40.0000 mg | ORAL_TABLET | Freq: Every day | ORAL | 0 refills | Status: AC

## 2021-08-28 NOTE — Discharge Instructions (Addendum)
Your chest x-ray was clear.  Albuterol inhaler--do 2 puffs every 4 hours as needed for shortness of breath or wheezing  Doxycycline 100 mg-Numbness1 capsule 2 times daily for 7 days.  This is an antibiotic  Take prednisone 20 mg--2 tablets daily for 3 days.  As you know, this can raise your sugars.  Be as good as you can on your diet the next few days, and drink plenty of fluids

## 2021-08-28 NOTE — ED Triage Notes (Signed)
Pt here for cough and chest congestion x 3 weeks

## 2021-08-28 NOTE — ED Provider Notes (Signed)
[facility]    CSN: [id number] Arrival date & time: 08/28/21  1458      History   Chief Complaint Chief Complaint  Patient presents with   Cough    HPI [name] is a 36 y.o. male.    Cough Here for 3 weeks of nasal congestion, postnasal drainage, cough and chest congestion.  He feels like something is sitting on his chest.  He reports he does not have a history of asthma, but he is been prescribed an inhaler several times in his life for "bronchitis".  He had fever maybe about a week ago.  He has had some posttussive emesis, but no vomiting and diarrhea otherwise.  He has had a little bit of sinus pressure and is had his sinus secretions thicken up  Past Medical History:  Diagnosis Date   Anxiety    Atypical chest pain 04/2017   w/SOB. Pain radiates down left arm at times. Fam Hx of heart disease   Celiac disease    CTS (carpal tunnel syndrome) 2009   (R)   Diabetes mellitus 05/1997   type I    DKA (diabetic ketoacidoses) 2003 & 2004   GERD (gastroesophageal reflux disease)    Hyperlipidemia    Hypothyroidism    Pilonidal cyst 2008   Seizure ([place]) 2002   hypoglycemic   Seizures ([place]) 1990, 1991   febrile seizures x 3    Patient Active Problem List   Diagnosis Date Noted   Celiac disease 07/31/2019   Chest pain of uncertain etiology 73/71/0626   Diabetes mellitus type 1, controlled, insulin dependent ([place]) 07/30/2019   Hypothyroidism 07/30/2019   Herpes zoster 07/28/2013    Past Surgical History:  Procedure Laterality Date   none     TYMPANOSTOMY TUBE PLACEMENT  1990       Home Medications    Prior to Admission medications   Medication Sig Start Date End Date Taking? Authorizing Provider  albuterol (VENTOLIN HFA) 108 (90 Base) MCG/ACT inhaler Inhale 1-2 puffs into the lungs every 4 (four) hours as needed for wheezing or shortness of breath. 08/28/21  Yes Nomar Broad, Gwenlyn Perking, MD  doxycycline (VIBRAMYCIN) 100 MG capsule Take 1  capsule (100 mg total) by mouth 2 (two) times daily for 7 days. 08/28/21 09/04/21 Yes Emilyanne Mcgough, Gwenlyn Perking, MD  predniSONE (DELTASONE) 20 MG tablet Take 2 tablets (40 mg total) by mouth daily with breakfast for 3 days. 08/28/21 08/31/21 Yes Barrett Henle, MD  buPROPion (WELLBUTRIN) 75 MG tablet Take 75 mg by mouth at bedtime. Patient not taking: Reported on 07/18/2021 05/30/19   [provider]  busPIRone (BUSPAR) 10 MG tablet Take 1 tablet (10 mg total) by mouth 2 (two) times daily as needed. Patient taking differently: Take 10 mg by mouth 2 (two) times daily as needed (Anxiety). 05/19/21     DULoxetine (CYMBALTA) 60 MG capsule Take 1 capsule (60 mg total) by mouth daily. 03/05/21     insulin aspart (NOVOLOG) 100 UNIT/ML injection Inject 200 Units into the skin See admin instructions. 200 units into pump    [provider]  Insulin Disposable Pump (OMNIPOD DASH SYSTEM) KIT by Does not apply route.    [provider]  insulin lispro (HUMALOG) 100 UNIT/ML injection USE IN OMNIPOD DAILY (CHO 10, ISF 35, TARGET 120) USE 100 UNITS MAX DAILY DOSE OR INCREASE AS DIRECTED BY MD. Patient not taking: Reported on 07/18/2021 03/01/21     levothyroxine (SYNTHROID) 100 MCG tablet Take  1 tablet (100 mcg total) by mouth daily on an empty stomach 30 minutes before a meal 04/22/21     omeprazole (PRILOSEC) 40 MG capsule Take 1 capsule (40 mg total) by mouth daily. 04/22/21     rosuvastatin (CRESTOR) 5 MG tablet Take 1 tablet (5 mg total) by mouth daily. 06/16/21       Family History Family History  Problem Relation Age of Onset   Heart disease Other    Hypertension Maternal Grandfather    High Cholesterol Maternal Grandfather    Hypertension Paternal Grandfather    High Cholesterol Paternal Grandfather    Celiac disease Mother    Irritable bowel syndrome Father    Colon cancer Neg Hx    Colon polyps Neg Hx    Esophageal cancer Neg Hx    Rectal cancer Neg Hx    Stomach cancer Neg Hx      Social History Social History   Tobacco Use   Smoking status: Every Day    Packs/day: 1.00    Years: 10.00    Pack years: 10.00    Types: Cigarettes   Smokeless tobacco: Never  Vaping Use   Vaping Use: Never used  Substance Use Topics   Alcohol use: Not Currently    Comment: occasional   Drug use: No     Allergies   Codeine and Prednisone   Review of Systems Review of Systems  Respiratory:  Positive for cough.     Physical Exam Triage Vital Signs ED Triage Vitals  Enc Vitals Group     BP 08/28/21 1534 133/68     Pulse Rate 08/28/21 1534 87     Resp 08/28/21 1534 18     Temp 08/28/21 1534 98.4 F (36.9 C)     Temp Source 08/28/21 1534 Oral     SpO2 08/28/21 1534 96 %     Weight --      Height --      Head Circumference --      Peak Flow --      Pain Score 08/28/21 1535 0     Pain Loc --      Pain Edu? --      Excl. in [place]? --    No data found.  Updated Vital Signs BP 133/68 (BP Location: Left Arm)   Pulse 87   Temp 98.4 F (36.9 C) (Oral)   Resp 18   SpO2 96%   Visual Acuity Right Eye Distance:   Left Eye Distance:   Bilateral Distance:    Right Eye Near:   Left Eye Near:    Bilateral Near:     Physical Exam Vitals reviewed.  Constitutional:      General: He is not in acute distress.    Appearance: He is not toxic-appearing.  HENT:     Right Ear: Tympanic membrane and ear canal normal.     Left Ear: Tympanic membrane and ear canal normal.     Nose: Nose normal.     Mouth/Throat:     Mouth: Mucous membranes are moist.     Pharynx: No oropharyngeal exudate or posterior oropharyngeal erythema.  Eyes:     Extraocular Movements: Extraocular movements intact.     Conjunctiva/sclera: Conjunctivae normal.     Pupils: Pupils are equal, round, and reactive to light.  Cardiovascular:     Rate and Rhythm: Normal rate and regular rhythm.     Heart sounds: No murmur heard. Pulmonary:     Effort: No  respiratory distress.     Breath sounds:  No stridor. No wheezing, rhonchi or rales.  Musculoskeletal:     Cervical back: Neck supple.  Lymphadenopathy:     Cervical: No cervical adenopathy.  Skin:    Capillary Refill: Capillary refill takes less than 2 seconds.     Coloration: Skin is not jaundiced or pale.  Neurological:     General: No focal deficit present.     Mental Status: He is alert and oriented to person, place, and time.  Psychiatric:        Behavior: Behavior normal.     UC Treatments / Results  Labs (all labs ordered are listed, but only abnormal results are displayed) Labs Reviewed - No data to display  EKG   Radiology DG Chest 2 View  Result Date: 08/28/2021 CLINICAL DATA:  Cough and chest congestion for the past 3 weeks. Smoker. EXAM: CHEST - 2 VIEW COMPARISON:  04/28/2018 FINDINGS: Normal sized heart. Clear lungs. Mild peribronchial thickening without significant change. Unremarkable bones. IMPRESSION: No acute abnormality.  Stable mild chronic bronchitic changes. Electronically Signed   By: Claudie Revering M.D.   On: 08/28/2021 15:46    Procedures Procedures (including critical care time)  Medications Ordered in UC Medications - No data to display  Initial Impression / Assessment and Plan / UC Course  I have reviewed the triage vital signs and the nursing notes.  Pertinent labs & imaging results that were available during my care of the patient were reviewed by me and considered in my medical decision making (see chart for details).     Chest x-ray is clear, and we will treat for asthma exacerbation.  He knows to be good on his diet in the next 2 to 3 days while he takes the prednisone.  He is a type I diabetic Final Clinical Impressions(s) / UC Diagnoses   Final diagnoses:  Mild intermittent asthma with acute exacerbation  Acute sinusitis, recurrence not specified, unspecified location     Discharge Instructions      Your chest x-ray was clear.  Albuterol inhaler--do 2 puffs every 4  hours as needed for shortness of breath or wheezing  Doxycycline 100 mg-Numbness1 capsule 2 times daily for 7 days.  This is an antibiotic  Take prednisone 20 mg--2 tablets daily for 3 days.  As you know, this can raise your sugars.  Be as good as you can on your diet the next few days, and drink plenty of fluids     ED Prescriptions     Medication Sig Dispense Auth. Provider   doxycycline (VIBRAMYCIN) 100 MG capsule Take 1 capsule (100 mg total) by mouth 2 (two) times daily for 7 days. 14 capsule Zaeda Mcferran, Gwenlyn Perking, MD   albuterol (VENTOLIN HFA) 108 (90 Base) MCG/ACT inhaler Inhale 1-2 puffs into the lungs every 4 (four) hours as needed for wheezing or shortness of breath. 1 each Barrett Henle, MD   predniSONE (DELTASONE) 20 MG tablet Take 2 tablets (40 mg total) by mouth daily with breakfast for 3 days. 6 tablet Windy Carina Gwenlyn Perking, MD      PDMP not reviewed this encounter.   Barrett Henle, MD 08/28/21 1556

## 2021-09-03 ENCOUNTER — Other Ambulatory Visit (HOSPITAL_COMMUNITY): Payer: Self-pay

## 2021-09-06 ENCOUNTER — Other Ambulatory Visit (HOSPITAL_COMMUNITY): Payer: Self-pay

## 2021-09-07 ENCOUNTER — Other Ambulatory Visit (HOSPITAL_COMMUNITY): Payer: Self-pay

## 2021-09-07 MED ORDER — INSULIN LISPRO 100 UNIT/ML IJ SOLN
Freq: Every day | INTRAMUSCULAR | 3 refills | Status: DC
  Filled 2021-09-07: qty 40, 40d supply, fill #0
  Filled 2021-12-02: qty 40, 40d supply, fill #1
  Filled 2022-01-20: qty 40, 40d supply, fill #2
  Filled 2022-03-25: qty 40, 40d supply, fill #3

## 2021-09-24 DIAGNOSIS — F418 Other specified anxiety disorders: Secondary | ICD-10-CM | POA: Diagnosis not present

## 2021-09-24 DIAGNOSIS — R739 Hyperglycemia, unspecified: Secondary | ICD-10-CM | POA: Diagnosis not present

## 2021-09-24 DIAGNOSIS — E538 Deficiency of other specified B group vitamins: Secondary | ICD-10-CM | POA: Diagnosis not present

## 2021-09-24 DIAGNOSIS — E785 Hyperlipidemia, unspecified: Secondary | ICD-10-CM | POA: Diagnosis not present

## 2021-09-24 DIAGNOSIS — E039 Hypothyroidism, unspecified: Secondary | ICD-10-CM | POA: Diagnosis not present

## 2021-09-24 DIAGNOSIS — R7989 Other specified abnormal findings of blood chemistry: Secondary | ICD-10-CM | POA: Diagnosis not present

## 2021-09-25 DIAGNOSIS — Z794 Long term (current) use of insulin: Secondary | ICD-10-CM | POA: Diagnosis not present

## 2021-09-25 DIAGNOSIS — E1065 Type 1 diabetes mellitus with hyperglycemia: Secondary | ICD-10-CM | POA: Diagnosis not present

## 2021-09-25 DIAGNOSIS — E1040 Type 1 diabetes mellitus with diabetic neuropathy, unspecified: Secondary | ICD-10-CM | POA: Diagnosis not present

## 2021-09-25 DIAGNOSIS — E109 Type 1 diabetes mellitus without complications: Secondary | ICD-10-CM | POA: Diagnosis not present

## 2021-09-30 ENCOUNTER — Other Ambulatory Visit (HOSPITAL_COMMUNITY): Payer: Self-pay

## 2021-09-30 DIAGNOSIS — G629 Polyneuropathy, unspecified: Secondary | ICD-10-CM | POA: Diagnosis not present

## 2021-09-30 DIAGNOSIS — Z1331 Encounter for screening for depression: Secondary | ICD-10-CM | POA: Diagnosis not present

## 2021-09-30 DIAGNOSIS — F17210 Nicotine dependence, cigarettes, uncomplicated: Secondary | ICD-10-CM | POA: Diagnosis not present

## 2021-09-30 DIAGNOSIS — E039 Hypothyroidism, unspecified: Secondary | ICD-10-CM | POA: Diagnosis not present

## 2021-09-30 DIAGNOSIS — Z0000 Encounter for general adult medical examination without abnormal findings: Secondary | ICD-10-CM | POA: Diagnosis not present

## 2021-09-30 DIAGNOSIS — E1042 Type 1 diabetes mellitus with diabetic polyneuropathy: Secondary | ICD-10-CM | POA: Diagnosis not present

## 2021-09-30 DIAGNOSIS — Z4681 Encounter for fitting and adjustment of insulin pump: Secondary | ICD-10-CM | POA: Diagnosis not present

## 2021-09-30 DIAGNOSIS — E538 Deficiency of other specified B group vitamins: Secondary | ICD-10-CM | POA: Diagnosis not present

## 2021-09-30 DIAGNOSIS — E785 Hyperlipidemia, unspecified: Secondary | ICD-10-CM | POA: Diagnosis not present

## 2021-09-30 DIAGNOSIS — Z1389 Encounter for screening for other disorder: Secondary | ICD-10-CM | POA: Diagnosis not present

## 2021-09-30 DIAGNOSIS — R82998 Other abnormal findings in urine: Secondary | ICD-10-CM | POA: Diagnosis not present

## 2021-09-30 DIAGNOSIS — F329 Major depressive disorder, single episode, unspecified: Secondary | ICD-10-CM | POA: Diagnosis not present

## 2021-09-30 MED ORDER — BUPROPION HCL ER (XL) 150 MG PO TB24
150.0000 mg | ORAL_TABLET | Freq: Every morning | ORAL | 3 refills | Status: DC
  Filled 2021-09-30: qty 90, 90d supply, fill #0

## 2021-09-30 MED ORDER — KETOCONAZOLE 2 % EX CREA
TOPICAL_CREAM | CUTANEOUS | 1 refills | Status: DC
  Filled 2021-09-30: qty 30, 14d supply, fill #0

## 2021-10-04 ENCOUNTER — Other Ambulatory Visit (HOSPITAL_COMMUNITY): Payer: Self-pay

## 2021-10-04 MED ORDER — ESCITALOPRAM OXALATE 10 MG PO TABS
10.0000 mg | ORAL_TABLET | Freq: Every day | ORAL | 1 refills | Status: DC
  Filled 2021-10-04: qty 30, 30d supply, fill #0

## 2021-10-04 MED ORDER — ESCITALOPRAM OXALATE 10 MG PO TABS
5.0000 mg | ORAL_TABLET | Freq: Every day | ORAL | 1 refills | Status: DC
  Filled 2021-10-04: qty 30, 30d supply, fill #0

## 2021-10-22 ENCOUNTER — Other Ambulatory Visit (HOSPITAL_COMMUNITY): Payer: Self-pay

## 2021-10-26 DIAGNOSIS — E109 Type 1 diabetes mellitus without complications: Secondary | ICD-10-CM | POA: Diagnosis not present

## 2021-10-26 DIAGNOSIS — E1040 Type 1 diabetes mellitus with diabetic neuropathy, unspecified: Secondary | ICD-10-CM | POA: Diagnosis not present

## 2021-10-26 DIAGNOSIS — Z794 Long term (current) use of insulin: Secondary | ICD-10-CM | POA: Diagnosis not present

## 2021-10-26 DIAGNOSIS — E1065 Type 1 diabetes mellitus with hyperglycemia: Secondary | ICD-10-CM | POA: Diagnosis not present

## 2021-11-23 ENCOUNTER — Other Ambulatory Visit (HOSPITAL_COMMUNITY): Payer: Self-pay

## 2021-11-23 MED ORDER — BUSPIRONE HCL 10 MG PO TABS
10.0000 mg | ORAL_TABLET | Freq: Two times a day (BID) | ORAL | 2 refills | Status: DC | PRN
  Filled 2021-11-23 – 2022-03-25 (×2): qty 60, 30d supply, fill #0
  Filled 2022-05-27: qty 60, 30d supply, fill #1
  Filled 2022-07-15: qty 60, 30d supply, fill #2

## 2021-11-23 MED ORDER — BUSPIRONE HCL 10 MG PO TABS
10.0000 mg | ORAL_TABLET | Freq: Two times a day (BID) | ORAL | 2 refills | Status: DC | PRN
Start: 2021-11-23 — End: 2022-12-13
  Filled 2021-11-23 – 2021-12-02 (×2): qty 60, 30d supply, fill #0
  Filled 2022-01-20: qty 60, 30d supply, fill #1
  Filled 2022-09-05: qty 60, 30d supply, fill #2

## 2021-11-25 DIAGNOSIS — E1065 Type 1 diabetes mellitus with hyperglycemia: Secondary | ICD-10-CM | POA: Diagnosis not present

## 2021-11-25 DIAGNOSIS — Z794 Long term (current) use of insulin: Secondary | ICD-10-CM | POA: Diagnosis not present

## 2021-11-25 DIAGNOSIS — E1040 Type 1 diabetes mellitus with diabetic neuropathy, unspecified: Secondary | ICD-10-CM | POA: Diagnosis not present

## 2021-11-25 DIAGNOSIS — E109 Type 1 diabetes mellitus without complications: Secondary | ICD-10-CM | POA: Diagnosis not present

## 2021-12-01 ENCOUNTER — Other Ambulatory Visit (HOSPITAL_COMMUNITY): Payer: Self-pay

## 2021-12-02 ENCOUNTER — Other Ambulatory Visit (HOSPITAL_COMMUNITY): Payer: Self-pay

## 2021-12-25 DIAGNOSIS — Z794 Long term (current) use of insulin: Secondary | ICD-10-CM | POA: Diagnosis not present

## 2021-12-25 DIAGNOSIS — E1065 Type 1 diabetes mellitus with hyperglycemia: Secondary | ICD-10-CM | POA: Diagnosis not present

## 2021-12-25 DIAGNOSIS — E109 Type 1 diabetes mellitus without complications: Secondary | ICD-10-CM | POA: Diagnosis not present

## 2021-12-25 DIAGNOSIS — E1040 Type 1 diabetes mellitus with diabetic neuropathy, unspecified: Secondary | ICD-10-CM | POA: Diagnosis not present

## 2022-01-05 ENCOUNTER — Other Ambulatory Visit (HOSPITAL_COMMUNITY): Payer: Self-pay

## 2022-01-20 ENCOUNTER — Other Ambulatory Visit (HOSPITAL_COMMUNITY): Payer: Self-pay

## 2022-01-26 DIAGNOSIS — E109 Type 1 diabetes mellitus without complications: Secondary | ICD-10-CM | POA: Diagnosis not present

## 2022-01-26 DIAGNOSIS — Z794 Long term (current) use of insulin: Secondary | ICD-10-CM | POA: Diagnosis not present

## 2022-01-26 DIAGNOSIS — E1065 Type 1 diabetes mellitus with hyperglycemia: Secondary | ICD-10-CM | POA: Diagnosis not present

## 2022-01-26 DIAGNOSIS — E1040 Type 1 diabetes mellitus with diabetic neuropathy, unspecified: Secondary | ICD-10-CM | POA: Diagnosis not present

## 2022-02-09 ENCOUNTER — Ambulatory Visit: Payer: 59 | Admitting: Psychiatry

## 2022-02-16 DIAGNOSIS — E1042 Type 1 diabetes mellitus with diabetic polyneuropathy: Secondary | ICD-10-CM | POA: Diagnosis not present

## 2022-02-16 DIAGNOSIS — E310 Autoimmune polyglandular failure: Secondary | ICD-10-CM | POA: Diagnosis not present

## 2022-02-16 DIAGNOSIS — K900 Celiac disease: Secondary | ICD-10-CM | POA: Diagnosis not present

## 2022-02-16 DIAGNOSIS — E538 Deficiency of other specified B group vitamins: Secondary | ICD-10-CM | POA: Diagnosis not present

## 2022-02-16 DIAGNOSIS — E785 Hyperlipidemia, unspecified: Secondary | ICD-10-CM | POA: Diagnosis not present

## 2022-02-16 DIAGNOSIS — F17210 Nicotine dependence, cigarettes, uncomplicated: Secondary | ICD-10-CM | POA: Diagnosis not present

## 2022-02-16 DIAGNOSIS — G629 Polyneuropathy, unspecified: Secondary | ICD-10-CM | POA: Diagnosis not present

## 2022-02-16 DIAGNOSIS — G4730 Sleep apnea, unspecified: Secondary | ICD-10-CM | POA: Diagnosis not present

## 2022-02-16 DIAGNOSIS — E039 Hypothyroidism, unspecified: Secondary | ICD-10-CM | POA: Diagnosis not present

## 2022-02-22 ENCOUNTER — Other Ambulatory Visit (HOSPITAL_COMMUNITY): Payer: Self-pay

## 2022-02-22 MED ORDER — DULOXETINE HCL 60 MG PO CPEP
60.0000 mg | ORAL_CAPSULE | Freq: Every day | ORAL | 3 refills | Status: DC
  Filled 2022-05-27: qty 90, 90d supply, fill #0
  Filled 2022-09-05: qty 90, 90d supply, fill #1
  Filled 2022-12-13: qty 90, 90d supply, fill #2

## 2022-02-22 MED ORDER — DULOXETINE HCL 60 MG PO CPEP
60.0000 mg | ORAL_CAPSULE | Freq: Every day | ORAL | 3 refills | Status: DC
  Filled 2022-02-22: qty 90, 90d supply, fill #0

## 2022-02-23 ENCOUNTER — Other Ambulatory Visit (HOSPITAL_COMMUNITY): Payer: Self-pay

## 2022-02-25 DIAGNOSIS — E1065 Type 1 diabetes mellitus with hyperglycemia: Secondary | ICD-10-CM | POA: Diagnosis not present

## 2022-02-25 DIAGNOSIS — E109 Type 1 diabetes mellitus without complications: Secondary | ICD-10-CM | POA: Diagnosis not present

## 2022-02-25 DIAGNOSIS — E1040 Type 1 diabetes mellitus with diabetic neuropathy, unspecified: Secondary | ICD-10-CM | POA: Diagnosis not present

## 2022-02-25 DIAGNOSIS — Z794 Long term (current) use of insulin: Secondary | ICD-10-CM | POA: Diagnosis not present

## 2022-03-01 DIAGNOSIS — E103299 Type 1 diabetes mellitus with mild nonproliferative diabetic retinopathy without macular edema, unspecified eye: Secondary | ICD-10-CM | POA: Diagnosis not present

## 2022-03-01 DIAGNOSIS — H5203 Hypermetropia, bilateral: Secondary | ICD-10-CM | POA: Diagnosis not present

## 2022-03-22 DIAGNOSIS — E1042 Type 1 diabetes mellitus with diabetic polyneuropathy: Secondary | ICD-10-CM | POA: Diagnosis not present

## 2022-03-25 ENCOUNTER — Other Ambulatory Visit (HOSPITAL_COMMUNITY): Payer: Self-pay

## 2022-03-27 DIAGNOSIS — E1040 Type 1 diabetes mellitus with diabetic neuropathy, unspecified: Secondary | ICD-10-CM | POA: Diagnosis not present

## 2022-03-27 DIAGNOSIS — E109 Type 1 diabetes mellitus without complications: Secondary | ICD-10-CM | POA: Diagnosis not present

## 2022-03-27 DIAGNOSIS — Z794 Long term (current) use of insulin: Secondary | ICD-10-CM | POA: Diagnosis not present

## 2022-03-27 DIAGNOSIS — E1065 Type 1 diabetes mellitus with hyperglycemia: Secondary | ICD-10-CM | POA: Diagnosis not present

## 2022-04-05 ENCOUNTER — Other Ambulatory Visit (HOSPITAL_COMMUNITY): Payer: Self-pay

## 2022-04-07 DIAGNOSIS — B078 Other viral warts: Secondary | ICD-10-CM | POA: Diagnosis not present

## 2022-04-08 ENCOUNTER — Ambulatory Visit: Payer: 59 | Admitting: Psychiatry

## 2022-04-19 ENCOUNTER — Other Ambulatory Visit (HOSPITAL_COMMUNITY): Payer: Self-pay

## 2022-04-19 MED ORDER — LEVOTHYROXINE SODIUM 100 MCG PO TABS
100.0000 ug | ORAL_TABLET | Freq: Every day | ORAL | 3 refills | Status: AC
  Filled 2022-04-19: qty 90, 90d supply, fill #0
  Filled 2022-07-15: qty 90, 90d supply, fill #1
  Filled 2022-11-04 (×2): qty 90, 90d supply, fill #2
  Filled 2023-02-16: qty 90, 90d supply, fill #3

## 2022-04-19 MED ORDER — OMEPRAZOLE 40 MG PO CPDR
40.0000 mg | DELAYED_RELEASE_CAPSULE | Freq: Every day | ORAL | 4 refills | Status: AC
  Filled 2022-04-19: qty 90, 90d supply, fill #0
  Filled 2022-07-15: qty 90, 90d supply, fill #1
  Filled 2022-11-04 (×2): qty 90, 90d supply, fill #2
  Filled 2023-02-16: qty 90, 90d supply, fill #3

## 2022-05-27 ENCOUNTER — Other Ambulatory Visit (HOSPITAL_COMMUNITY): Payer: Self-pay

## 2022-05-27 MED ORDER — ROSUVASTATIN CALCIUM 5 MG PO TABS
5.0000 mg | ORAL_TABLET | Freq: Every day | ORAL | 3 refills | Status: DC
  Filled 2022-05-27: qty 90, 90d supply, fill #0

## 2022-05-27 MED ORDER — ROSUVASTATIN CALCIUM 5 MG PO TABS
5.0000 mg | ORAL_TABLET | Freq: Every day | ORAL | 3 refills | Status: AC
  Filled 2022-05-27: qty 90, 90d supply, fill #0
  Filled 2022-09-05: qty 90, 90d supply, fill #1
  Filled 2022-12-13: qty 90, 90d supply, fill #2
  Filled 2023-03-21: qty 90, 90d supply, fill #3

## 2022-06-05 ENCOUNTER — Emergency Department (HOSPITAL_COMMUNITY)
Admission: EM | Admit: 2022-06-05 | Discharge: 2022-06-06 | Disposition: A | Discharge status: Home / Self Care | Payer: Commercial Managed Care - PPO | Attending: Student | Admitting: Student

## 2022-06-05 ENCOUNTER — Encounter (HOSPITAL_COMMUNITY): Payer: Self-pay | Admitting: Emergency Medicine

## 2022-06-05 ENCOUNTER — Other Ambulatory Visit: Payer: Self-pay

## 2022-06-05 DIAGNOSIS — I10 Essential (primary) hypertension: Secondary | ICD-10-CM | POA: Insufficient documentation

## 2022-06-05 DIAGNOSIS — D72829 Elevated white blood cell count, unspecified: Secondary | ICD-10-CM | POA: Insufficient documentation

## 2022-06-05 DIAGNOSIS — Z20822 Contact with and (suspected) exposure to covid-19: Secondary | ICD-10-CM | POA: Insufficient documentation

## 2022-06-05 DIAGNOSIS — R112 Nausea with vomiting, unspecified: Secondary | ICD-10-CM | POA: Insufficient documentation

## 2022-06-05 DIAGNOSIS — E039 Hypothyroidism, unspecified: Secondary | ICD-10-CM | POA: Diagnosis not present

## 2022-06-05 DIAGNOSIS — R197 Diarrhea, unspecified: Secondary | ICD-10-CM | POA: Diagnosis not present

## 2022-06-05 DIAGNOSIS — R000 Tachycardia, unspecified: Secondary | ICD-10-CM | POA: Insufficient documentation

## 2022-06-05 DIAGNOSIS — F17210 Nicotine dependence, cigarettes, uncomplicated: Secondary | ICD-10-CM | POA: Diagnosis not present

## 2022-06-05 DIAGNOSIS — E162 Hypoglycemia, unspecified: Secondary | ICD-10-CM | POA: Diagnosis not present

## 2022-06-05 DIAGNOSIS — Z794 Long term (current) use of insulin: Secondary | ICD-10-CM | POA: Insufficient documentation

## 2022-06-05 DIAGNOSIS — E1065 Type 1 diabetes mellitus with hyperglycemia: Secondary | ICD-10-CM | POA: Diagnosis not present

## 2022-06-05 DIAGNOSIS — R1110 Vomiting, unspecified: Secondary | ICD-10-CM

## 2022-06-05 DIAGNOSIS — R0689 Other abnormalities of breathing: Secondary | ICD-10-CM | POA: Diagnosis not present

## 2022-06-05 LAB — COMPREHENSIVE METABOLIC PANEL
ALT: 26 U/L (ref 0–44)
AST: 23 U/L (ref 15–41)
Albumin: 4.6 g/dL (ref 3.5–5.0)
Alkaline Phosphatase: 96 U/L (ref 38–126)
Anion gap: 9 (ref 5–15)
BUN: 12 mg/dL (ref 6–20)
CO2: 27 mmol/L (ref 22–32)
Calcium: 9.2 mg/dL (ref 8.9–10.3)
Chloride: 100 mmol/L (ref 98–111)
Creatinine, Ser: 0.92 mg/dL (ref 0.61–1.24)
GFR, Estimated: >60 mL/min (ref >60)
Glucose, Bld: 162 mg/dL — ABNORMAL HIGH (ref 70–99)
Potassium: 3.8 mmol/L (ref 3.5–5.1)
Sodium: 136 mmol/L (ref 135–145)
Total Bilirubin: 0.9 mg/dL (ref 0.3–1.2)
Total Protein: 8 g/dL (ref 6.5–8.1)

## 2022-06-05 LAB — CBC WITH DIFFERENTIAL/PLATELET
Abs Immature Granulocytes: 0.07 10*3/uL (ref 0.00–0.07)
Basophils Absolute: 0.1 10*3/uL (ref 0.0–0.1)
Basophils Relative: 1 %
Eosinophils Absolute: 0.4 10*3/uL (ref 0.0–0.5)
Eosinophils Relative: 2 %
HCT: 46.3 % (ref 39.0–52.0)
Hemoglobin: 16.2 g/dL (ref 13.0–17.0)
Immature Granulocytes: 0 %
Lymphocytes Relative: 3 %
Lymphs Abs: 0.4 10*3/uL — ABNORMAL LOW (ref 0.7–4.0)
MCH: 29.1 pg (ref 26.0–34.0)
MCHC: 35 g/dL (ref 30.0–36.0)
MCV: 83.3 fL (ref 80.0–100.0)
Monocytes Absolute: 0.9 10*3/uL (ref 0.1–1.0)
Monocytes Relative: 5 %
Neutro Abs: 15.7 10*3/uL — ABNORMAL HIGH (ref 1.7–7.7)
Neutrophils Relative %: 89 %
Platelets: 280 10*3/uL (ref 150–400)
RBC: 5.56 MIL/uL (ref 4.22–5.81)
RDW: 12.1 % (ref 11.5–15.5)
WBC: 17.6 10*3/uL — ABNORMAL HIGH (ref 4.0–10.5)
nRBC: 0 % (ref 0.0–0.2)

## 2022-06-05 LAB — CBG MONITORING, ED
Glucose-Capillary: 125 mg/dL — ABNORMAL HIGH (ref 70–99)
Glucose-Capillary: 109 mg/dL — ABNORMAL HIGH (ref 70–99)
Glucose-Capillary: 103 mg/dL — ABNORMAL HIGH (ref 70–99)

## 2022-06-05 LAB — RESP PANEL BY RT-PCR (RSV, FLU A&B, COVID)  RVPGX2
Influenza A by PCR: NEGATIVE
Influenza B by PCR: NEGATIVE
Resp Syncytial Virus by PCR: NEGATIVE
SARS Coronavirus 2 by RT PCR: NEGATIVE

## 2022-06-05 MED ORDER — PROCHLORPERAZINE EDISYLATE 10 MG/2ML IJ SOLN
10.0000 mg | Freq: Once | INTRAMUSCULAR | Status: AC
  Administered 2022-06-05: 10 mg via INTRAVENOUS
  Filled 2022-06-05: qty 2

## 2022-06-05 MED ORDER — ONDANSETRON HCL 4 MG/2ML IJ SOLN
4.0000 mg | Freq: Once | INTRAMUSCULAR | Status: AC
  Administered 2022-06-05: 4 mg via INTRAVENOUS
  Filled 2022-06-05: qty 2

## 2022-06-05 MED ORDER — DIPHENHYDRAMINE HCL 50 MG/ML IJ SOLN
25.0000 mg | Freq: Once | INTRAMUSCULAR | Status: AC
  Administered 2022-06-05: 25 mg via INTRAVENOUS
  Filled 2022-06-05: qty 1

## 2022-06-05 MED ORDER — LACTATED RINGERS IV BOLUS
1000.0000 mL | Freq: Once | INTRAVENOUS | Status: AC
  Administered 2022-06-06: 1000 mL via INTRAVENOUS

## 2022-06-05 MED ORDER — LACTATED RINGERS IV BOLUS
1000.0000 mL | Freq: Once | INTRAVENOUS | Status: AC
  Administered 2022-06-05: 1000 mL via INTRAVENOUS

## 2022-06-05 NOTE — ED Provider Notes (Signed)
[facility] Provider Note  CSN: [id number] Arrival date & time: 06/05/22 2053  Chief Complaint(s) Hypoglycemia  HPI [name] is a 37 y.o. male with PMH type 1 diabetes, celiac disease, GERD, HTN, hypothyroidism who presents emergency department for evaluation of nausea, vomiting, diarrhea and hyperglycemia.  History obtained from patient and patient's wife who is a nurse within the [facility] system who states that they have multiple children at home with vomiting and today the patient had an abrupt onset of vomiting and diarrhea.  He has been unable to tolerate p.o. today but his insulin pump has remained on.  They noticed the patient was somnolent and diaphoretic and found the patient sugar to be 27 via Dexcom.  Patient received an amp of D50 by EMS and arrives significant more awake with an initial glucose of 181 but is persistently vomiting here in the emergency department.  He denies any abdominal pain, chest pain, shortness of breath, headache, fever or other systemic symptoms.   Past Medical History Past Medical History:  Diagnosis Date   Anxiety    Atypical chest pain 04/2017   w/SOB. Pain radiates down left arm at times. Fam Hx of heart disease   Celiac disease    CTS (carpal tunnel syndrome) 2009   (R)   Diabetes mellitus 05/1997   type I    DKA (diabetic ketoacidoses) 2003 & 2004   GERD (gastroesophageal reflux disease)    Hyperlipidemia    Hypothyroidism    Pilonidal cyst 2008   Seizure ([place]) 2002   hypoglycemic   Seizures ([place]) 1990, 1991   febrile seizures x 3   Patient Active Problem List   Diagnosis Date Noted   Celiac disease 07/31/2019   Chest pain of uncertain etiology [id number]   Diabetes mellitus type 1, controlled, insulin dependent ([place]) 07/30/2019   Hypothyroidism 07/30/2019   Herpes zoster 07/28/2013   Home Medication(s) Prior to Admission medications   Medication Sig Start Date End Date Taking?  Authorizing Provider  ondansetron (ZOFRAN) 4 MG tablet Take 1 tablet (4 mg total) by mouth every 8 (eight) hours as needed for nausea or vomiting. 06/06/22  Yes Ripley Fraise, MD  albuterol (VENTOLIN HFA) 108 (90 Base) MCG/ACT inhaler Inhale 1-2 puffs into the lungs every 4 (four) hours as needed for wheezing or shortness of breath. 08/28/21   Barrett Henle, MD  buPROPion (WELLBUTRIN XL) 150 MG 24 hr tablet Take 1 tablet (150 mg total) by mouth in the morning. 09/30/21     buPROPion (WELLBUTRIN) 75 MG tablet Take 75 mg by mouth at bedtime. Patient not taking: Reported on 07/18/2021 05/30/19   [provider]  busPIRone (BUSPAR) 10 MG tablet Take 1 tablet (10 mg total) by mouth 2 (two) times daily as needed. 11/23/21     busPIRone (BUSPAR) 10 MG tablet Take 1 tablet (10 mg total) by mouth 2 (two) times daily as needed. 11/23/21     DULoxetine (CYMBALTA) 60 MG capsule Take 1 capsule (60 mg total) by mouth daily. 02/22/22     DULoxetine (CYMBALTA) 60 MG capsule Take 1 capsule (60 mg total) by mouth daily. 02/22/22     escitalopram (LEXAPRO) 10 MG tablet Start with 1/2 tablet (5 mg total) daily for 7 days then increase to 1 tablet (10 mg total) daily. 10/04/21     escitalopram (LEXAPRO) 10 MG tablet Start with 1/2 tablet daily for 7 days then increase to 1 tablet (10 mg total) by  mouth daily. 10/04/21     insulin aspart (NOVOLOG) 100 UNIT/ML injection Inject 200 Units into the skin See admin instructions. 200 units into pump    [provider]  Insulin Disposable Pump (OMNIPOD DASH SYSTEM) KIT by Does not apply route.    [provider]  insulin lispro (HUMALOG) 100 UNIT/ML injection USE IN OMNIPOD DAILY (CHO 10, ISF 35, TARGET 120) USE 100 UNITS MAX DAILY DOSE OR INCREASE AS DIRECTED BY MD. 09/06/21     ketoconazole (NIZORAL) 2 % cream Apply 1 application externally between toes 14 days 09/30/21     levothyroxine (SYNTHROID) 100 MCG tablet Take 1 tablet (100 mcg total) by mouth daily  on an empty stomach 30 minutes before a meal 04/19/22     omeprazole (PRILOSEC) 40 MG capsule TAKE 1 CAPSULE BY MOUTH ONCE DAILY 04/19/22     rosuvastatin (CRESTOR) 5 MG tablet Take 1 tablet (5 mg total) by mouth daily. 05/27/22                                                                                                                                       Past Surgical History Past Surgical History:  Procedure Laterality Date   none     TYMPANOSTOMY TUBE PLACEMENT  1990   Family History Family History  Problem Relation Age of Onset   Heart disease Other    Hypertension Maternal Grandfather    High Cholesterol Maternal Grandfather    Hypertension Paternal Grandfather    High Cholesterol Paternal Grandfather    Celiac disease Mother    Irritable bowel syndrome Father    Colon cancer Neg Hx    Colon polyps Neg Hx    Esophageal cancer Neg Hx    Rectal cancer Neg Hx    Stomach cancer Neg Hx     Social History Social History   Tobacco Use   Smoking status: Every Day    Packs/day: 1.00    Years: 10.00    Total pack years: 10.00    Types: Cigarettes   Smokeless tobacco: Never  Vaping Use   Vaping Use: Never used  Substance Use Topics   Alcohol use: Not Currently    Comment: occasional   Drug use: No   Allergies Codeine and Prednisone  Review of Systems Review of Systems  Gastrointestinal:  Positive for diarrhea, nausea and vomiting.    Physical Exam Vital Signs  I have reviewed the triage vital signs BP 119/67   Pulse (!) 113   Temp 99.6 F (37.6 C) (Oral)   Resp 20   Ht '5\' 7"'$  (1.702 m)   Wt 83.9 kg   SpO2 95%   BMI 28.97 kg/m   Physical Exam Constitutional:      General: He is not in acute distress.    Appearance: Normal appearance. He is ill-appearing.  HENT:     Head: Normocephalic and atraumatic.  Nose: No congestion or rhinorrhea.  Eyes:     General:        Right eye: No discharge.        Left eye: No discharge.     Extraocular Movements:  Extraocular movements intact.     Pupils: Pupils are equal, round, and reactive to light.  Cardiovascular:     Rate and Rhythm: Regular rhythm. Tachycardia present.     Heart sounds: No murmur heard. Pulmonary:     Effort: No respiratory distress.     Breath sounds: No wheezing or rales.  Abdominal:     General: There is no distension.     Tenderness: There is no abdominal tenderness.  Musculoskeletal:        General: Normal range of motion.     Cervical back: Normal range of motion.  Skin:    General: Skin is warm and dry.  Neurological:     General: No focal deficit present.     Mental Status: He is alert.     ED Results and Treatments Labs (all labs ordered are listed, but only abnormal results are displayed) Labs Reviewed  COMPREHENSIVE METABOLIC PANEL - Abnormal; Notable for the following components:      Result Value   Glucose, Bld 162 (*)    All other components within normal limits  CBC WITH DIFFERENTIAL/PLATELET - Abnormal; Notable for the following components:   WBC 17.6 (*)    Neutro Abs 15.7 (*)    Lymphs Abs 0.4 (*)    All other components within normal limits  CBG MONITORING, ED - Abnormal; Notable for the following components:   Glucose-Capillary 125 (*)    All other components within normal limits  CBG MONITORING, ED - Abnormal; Notable for the following components:   Glucose-Capillary 109 (*)    All other components within normal limits  CBG MONITORING, ED - Abnormal; Notable for the following components:   Glucose-Capillary 103 (*)    All other components within normal limits  CBG MONITORING, ED - Abnormal; Notable for the following components:   Glucose-Capillary 109 (*)    All other components within normal limits  CBG MONITORING, ED - Abnormal; Notable for the following components:   Glucose-Capillary 240 (*)    All other components within normal limits  RESP PANEL BY RT-PCR (RSV, FLU A&B, COVID)  RVPGX2                                                                                                                           Radiology No results found.  Pertinent labs & imaging results that were available during my care of the patient were reviewed by me and considered in my medical decision making (see MDM for details).  Medications Ordered in ED Medications  ondansetron (ZOFRAN) injection 4 mg (4 mg Intravenous Given 06/05/22 2119)  lactated ringers bolus 1,000 mL (0 mLs Intravenous Stopped 06/06/22 0010)  prochlorperazine (COMPAZINE) injection 10 mg (10 mg Intravenous Given  06/05/22 2206)  diphenhydrAMINE (BENADRYL) injection 25 mg (25 mg Intravenous Given 06/05/22 2206)  lactated ringers bolus 1,000 mL (0 mLs Intravenous Stopped 06/06/22 0210)  acetaminophen (TYLENOL) tablet 650 mg (650 mg Oral Given 06/06/22 0038)                                                                                                                                     Procedures .Critical Care  Performed by: Teressa Lower, MD Authorized by: Teressa Lower, MD   Critical care provider statement:    Critical care time (minutes):  30   Critical care was necessary to treat or prevent imminent or life-threatening deterioration of the following conditions:  Dehydration   Critical care was time spent personally by me on the following activities:  Development of treatment plan with patient or surrogate, discussions with consultants, evaluation of patient's response to treatment, examination of patient, ordering and review of laboratory studies, ordering and review of radiographic studies, ordering and performing treatments and interventions, pulse oximetry, re-evaluation of patient's condition and review of old charts   (including critical care time)  Medical Decision Making / ED Course   This patient presents to the ED for concern of nausea, vomiting, diarrhea, this involves an extensive number of treatment options, and is a complaint that carries with it a high  risk of complications and morbidity.  The differential diagnosis includes viral gastroenteritis, staphylococcal food poisoning, hypoglycemia, electrolyte abnormality, dehydration  MDM: Patient seen emergency room for evaluation of nausea, vomiting, diarrhea and hyperglycemia.  Physical exam reveals an ill-appearing tachycardic patient with no tenderness to palpation on abdominal exam but is actively vomiting.  Laboratory evaluation with a leukocytosis to 17.6, COVID, flu, RSV negative which was obtained in the setting of persistent vomiting and a viral illness.  Glucose did slightly trend down to 103 but is starting to level off.  Patient was given multiple rounds of antiemetics and on reevaluation is pending completion of additional fluid resuscitation and p.o. challenge.  Please see provider signout for continuation of workup.  Anticipate discharge home after ability to tolerate p.o.   Additional history obtained: -Additional history obtained from wife -External records from outside source obtained and reviewed including: Chart review including previous notes, labs, imaging, consultation notes   Lab Tests: -I ordered, reviewed, and interpreted labs.   The pertinent results include:   Labs Reviewed  COMPREHENSIVE METABOLIC PANEL - Abnormal; Notable for the following components:      Result Value   Glucose, Bld 162 (*)    All other components within normal limits  CBC WITH DIFFERENTIAL/PLATELET - Abnormal; Notable for the following components:   WBC 17.6 (*)    Neutro Abs 15.7 (*)    Lymphs Abs 0.4 (*)    All other components within normal limits  CBG MONITORING, ED - Abnormal; Notable for the following components:   Glucose-Capillary 125 (*)    All  other components within normal limits  CBG MONITORING, ED - Abnormal; Notable for the following components:   Glucose-Capillary 109 (*)    All other components within normal limits  CBG MONITORING, ED - Abnormal; Notable for the following  components:   Glucose-Capillary 103 (*)    All other components within normal limits  CBG MONITORING, ED - Abnormal; Notable for the following components:   Glucose-Capillary 109 (*)    All other components within normal limits  CBG MONITORING, ED - Abnormal; Notable for the following components:   Glucose-Capillary 240 (*)    All other components within normal limits  RESP PANEL BY RT-PCR (RSV, FLU A&B, COVID)  RVPGX2       Medicines ordered and prescription drug management: Meds ordered this encounter  Medications   ondansetron (ZOFRAN) injection 4 mg   lactated ringers bolus 1,000 mL   prochlorperazine (COMPAZINE) injection 10 mg   diphenhydrAMINE (BENADRYL) injection 25 mg   lactated ringers bolus 1,000 mL   acetaminophen (TYLENOL) tablet 650 mg   ondansetron (ZOFRAN) 4 MG tablet    Sig: Take 1 tablet (4 mg total) by mouth every 8 (eight) hours as needed for nausea or vomiting.    Dispense:  12 tablet    Refill:  0    -I have reviewed the patients home medicines and have made adjustments as needed  Critical interventions Fluid resuscitation   Cardiac Monitoring: The patient was maintained on a cardiac monitor.  I personally viewed and interpreted the cardiac monitored which showed an underlying rhythm of: Sinus tachycardia  Social Determinants of Health:  Factors impacting patients care include: none   Reevaluation: After the interventions noted above, I reevaluated the patient and found that they have :improved  Co morbidities that complicate the patient evaluation  Past Medical History:  Diagnosis Date   Anxiety    Atypical chest pain 04/2017   w/SOB. Pain radiates down left arm at times. Fam Hx of heart disease   Celiac disease    CTS (carpal tunnel syndrome) 2009   (R)   Diabetes mellitus 05/1997   type I    DKA (diabetic ketoacidoses) 2003 & 2004   GERD (gastroesophageal reflux disease)    Hyperlipidemia    Hypothyroidism    Pilonidal cyst 2008    Seizure ([place]) 2002   hypoglycemic   Seizures ([place]) 1990, 1991   febrile seizures x 3      Dispostion: I considered admission for this patient, and disposition pending ability to tolerate p.o.  Please see provider signout for continuation of workup    Final Clinical Impression(s) / ED Diagnoses Final diagnoses:  Vomiting and diarrhea     '@PCDICTATION'$ @    Teressa Lower, MD 06/06/22 1159

## 2022-06-05 NOTE — ED Triage Notes (Signed)
Pt brought in by [facility] from home for hypoglycemia. Pt given amp of D50, and zofran by EMS. CBG 27 by family, EMS CBG 59, pt given oral glucose CBG dropped to 50.  Pt CBG 189 by EMS on arrival to ED.

## 2022-06-06 LAB — CBG MONITORING, ED
Glucose-Capillary: 109 mg/dL — ABNORMAL HIGH (ref 70–99)
Glucose-Capillary: 240 mg/dL — ABNORMAL HIGH (ref 70–99)

## 2022-06-06 MED ORDER — ACETAMINOPHEN 325 MG PO TABS
650.0000 mg | ORAL_TABLET | Freq: Once | ORAL | Status: AC
  Administered 2022-06-06: 650 mg via ORAL
  Filled 2022-06-06: qty 2

## 2022-06-06 MED ORDER — ONDANSETRON HCL 4 MG PO TABS: 4.0000 mg | ORAL_TABLET | Freq: Three times a day (TID) | ORAL | 0 refills | Status: DC | PRN

## 2022-06-06 NOTE — ED Provider Notes (Signed)
I assumed care from [name] is a diabetic, here with nausea/vomiting/diarrhea and hypoglycemia due to poor intake He is now improved He is taking PO fluids He is tachycardic but improving, likely due to dehydration and fever Will d/c home Patient/wife comfortable with plan   Jacob Fraise, MD 06/06/22 [phone]

## 2022-06-13 ENCOUNTER — Other Ambulatory Visit (HOSPITAL_COMMUNITY): Payer: Self-pay

## 2022-06-13 MED ORDER — INSULIN LISPRO 100 UNIT/ML IJ SOLN
100.0000 [IU] | Freq: Every day | INTRAMUSCULAR | 3 refills | Status: DC
  Filled 2022-06-13: qty 40, 40d supply, fill #0
  Filled 2022-07-15: qty 40, 40d supply, fill #1
  Filled 2022-09-05: qty 40, 40d supply, fill #2
  Filled 2022-12-13: qty 40, 40d supply, fill #3

## 2022-06-20 DIAGNOSIS — E785 Hyperlipidemia, unspecified: Secondary | ICD-10-CM | POA: Diagnosis not present

## 2022-06-20 DIAGNOSIS — E310 Autoimmune polyglandular failure: Secondary | ICD-10-CM | POA: Diagnosis not present

## 2022-06-20 DIAGNOSIS — K219 Gastro-esophageal reflux disease without esophagitis: Secondary | ICD-10-CM | POA: Diagnosis not present

## 2022-06-20 DIAGNOSIS — E538 Deficiency of other specified B group vitamins: Secondary | ICD-10-CM | POA: Diagnosis not present

## 2022-06-20 DIAGNOSIS — F329 Major depressive disorder, single episode, unspecified: Secondary | ICD-10-CM | POA: Diagnosis not present

## 2022-06-20 DIAGNOSIS — E1042 Type 1 diabetes mellitus with diabetic polyneuropathy: Secondary | ICD-10-CM | POA: Diagnosis not present

## 2022-06-20 DIAGNOSIS — G629 Polyneuropathy, unspecified: Secondary | ICD-10-CM | POA: Diagnosis not present

## 2022-06-20 DIAGNOSIS — F17210 Nicotine dependence, cigarettes, uncomplicated: Secondary | ICD-10-CM | POA: Diagnosis not present

## 2022-06-20 DIAGNOSIS — G4730 Sleep apnea, unspecified: Secondary | ICD-10-CM | POA: Diagnosis not present

## 2022-06-20 DIAGNOSIS — E039 Hypothyroidism, unspecified: Secondary | ICD-10-CM | POA: Diagnosis not present

## 2022-06-27 IMAGING — DX DG CHEST 2V
2 series · 2 of 2 positions shown · non-contrast
Comparison: 04/28/2018

CLINICAL DATA: Cough and chest congestion for the past 3 weeks.
Smoker.

EXAM:
CHEST - 2 VIEW

[chest pa]
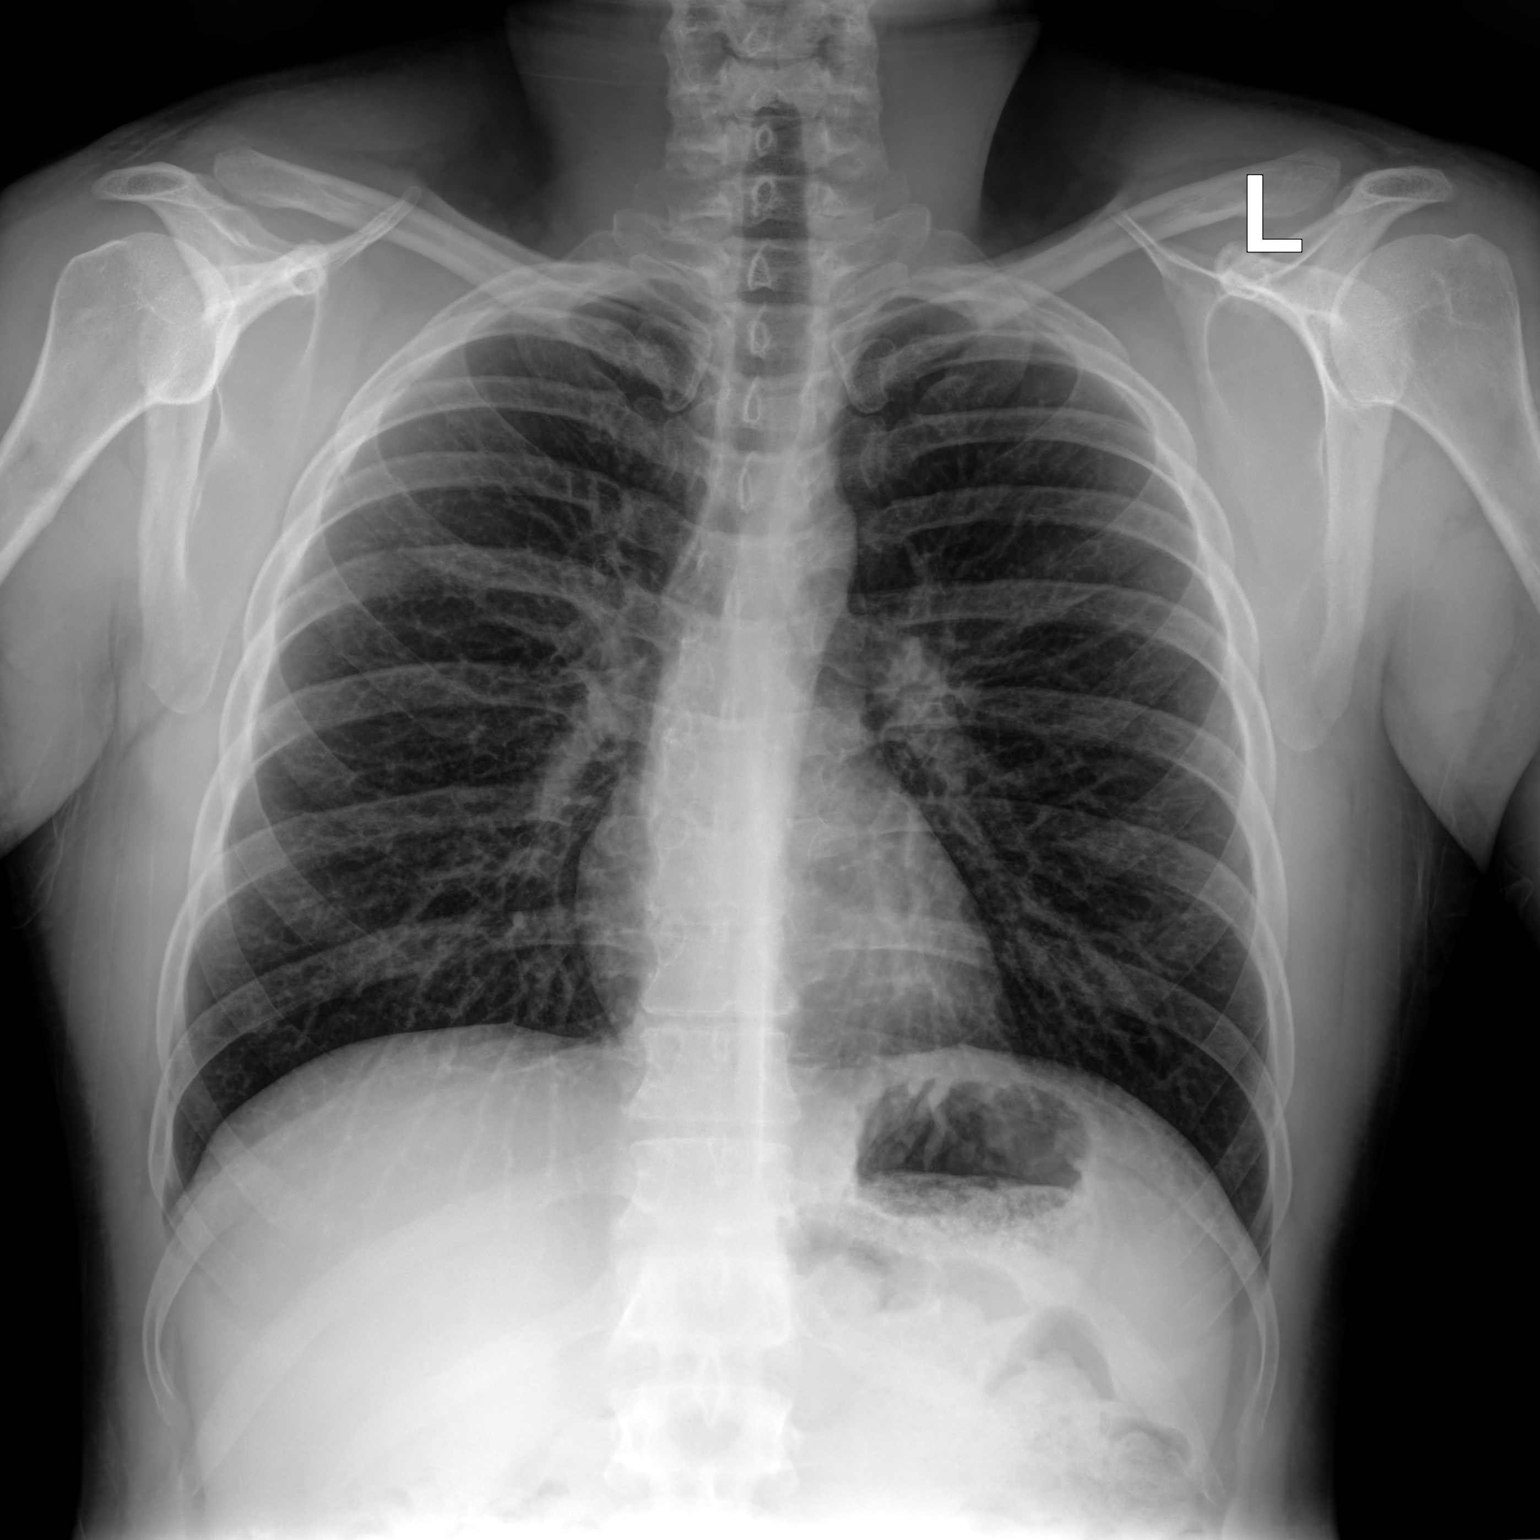

[chest lat]
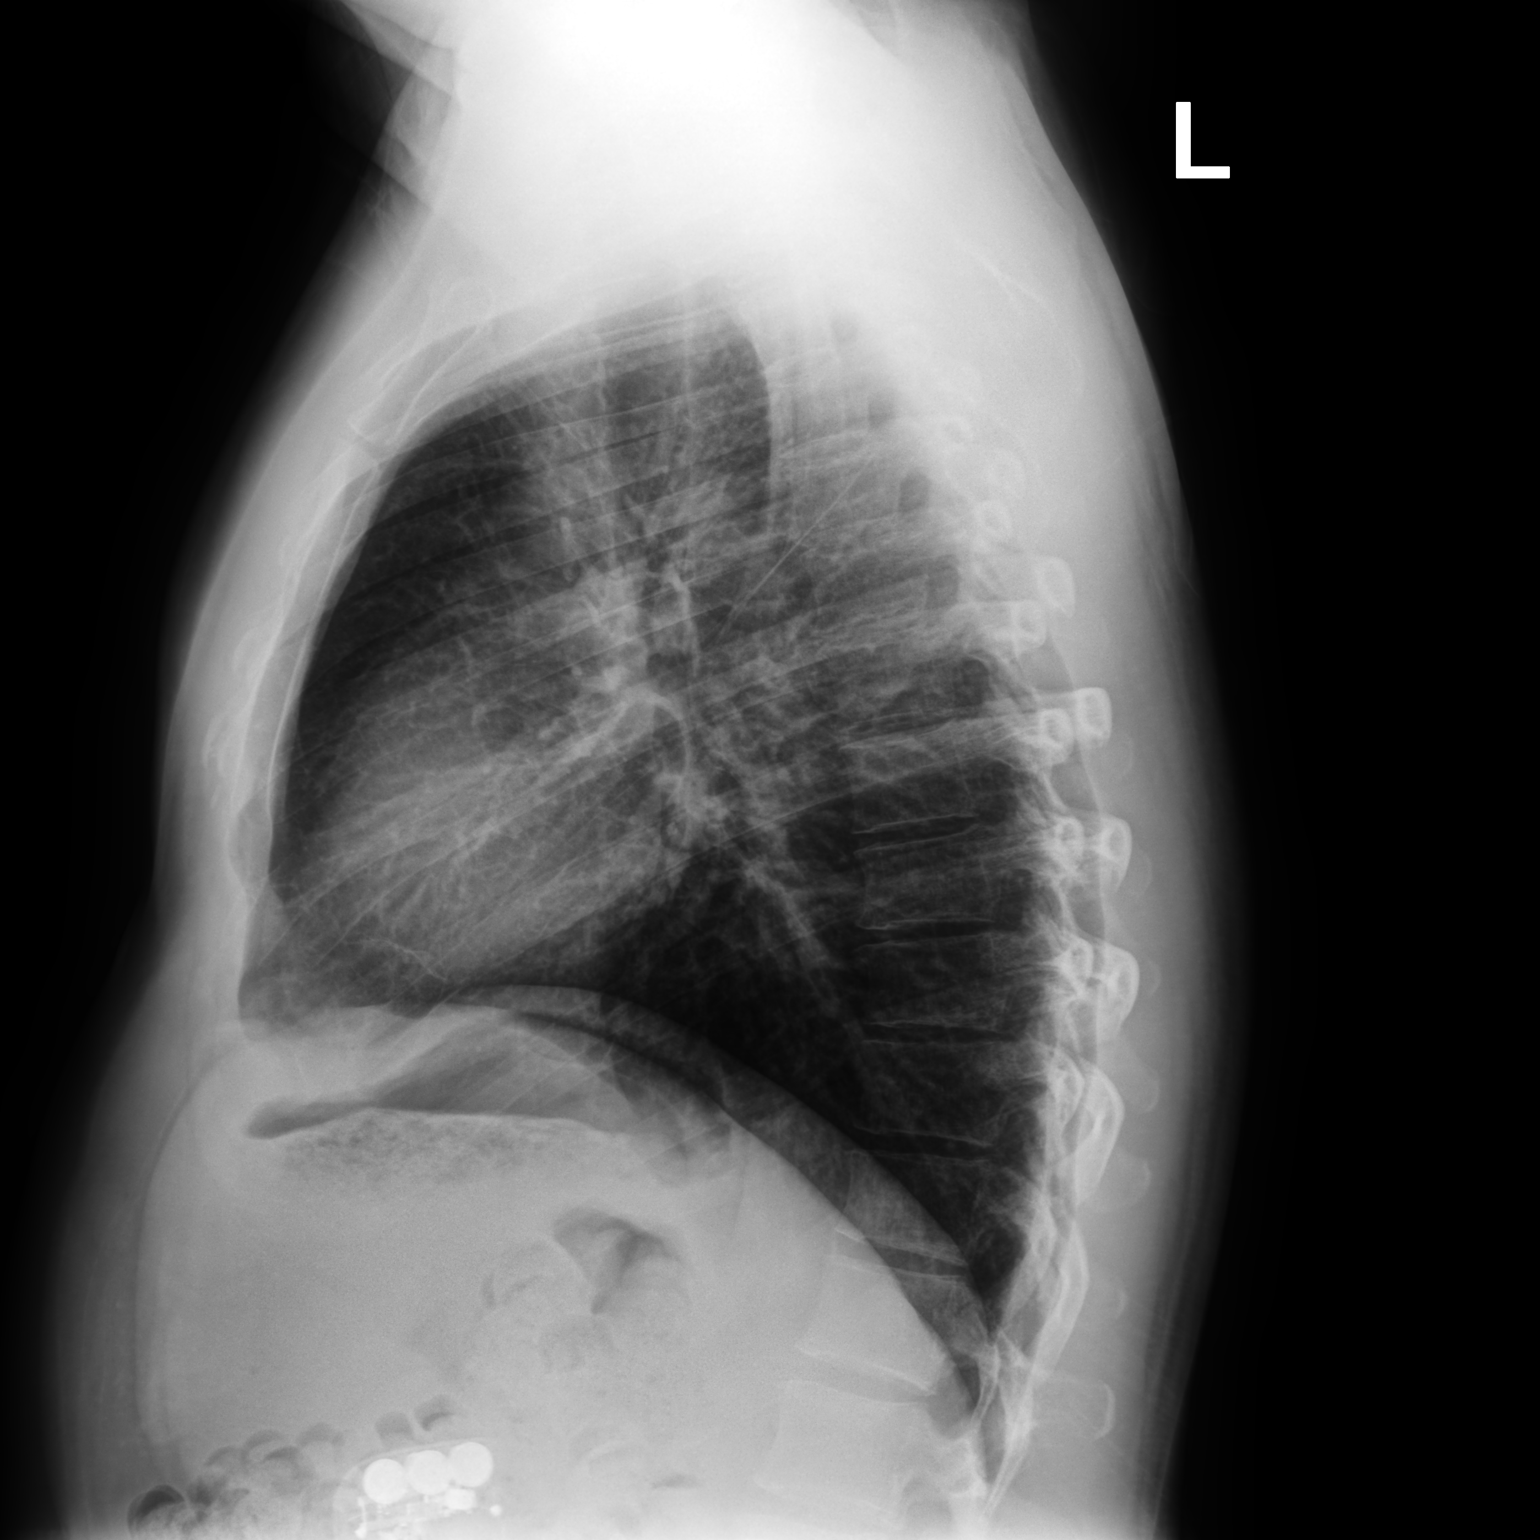

[2 of 2 positions shown; findings below may reference images not displayed]

FINDINGS: Normal sized heart. Clear lungs. Mild peribronchial thickening
without significant change. Unremarkable bones.
IMPRESSION: No acute abnormality.  Stable mild chronic bronchitic changes.

## 2022-07-11 DIAGNOSIS — E785 Hyperlipidemia, unspecified: Secondary | ICD-10-CM | POA: Diagnosis not present

## 2022-07-11 DIAGNOSIS — E1042 Type 1 diabetes mellitus with diabetic polyneuropathy: Secondary | ICD-10-CM | POA: Diagnosis not present

## 2022-07-11 DIAGNOSIS — Z4681 Encounter for fitting and adjustment of insulin pump: Secondary | ICD-10-CM | POA: Diagnosis not present

## 2022-07-11 DIAGNOSIS — Z794 Long term (current) use of insulin: Secondary | ICD-10-CM | POA: Diagnosis not present

## 2022-07-15 ENCOUNTER — Other Ambulatory Visit: Payer: Self-pay

## 2022-07-15 ENCOUNTER — Other Ambulatory Visit (HOSPITAL_COMMUNITY): Payer: Self-pay

## 2022-07-22 ENCOUNTER — Other Ambulatory Visit (HOSPITAL_COMMUNITY): Payer: Self-pay

## 2022-08-05 ENCOUNTER — Ambulatory Visit: Payer: Commercial Managed Care - PPO | Admitting: Physician Assistant

## 2022-09-05 ENCOUNTER — Other Ambulatory Visit (HOSPITAL_COMMUNITY): Payer: Self-pay

## 2022-09-30 DIAGNOSIS — E785 Hyperlipidemia, unspecified: Secondary | ICD-10-CM | POA: Diagnosis not present

## 2022-09-30 DIAGNOSIS — E538 Deficiency of other specified B group vitamins: Secondary | ICD-10-CM | POA: Diagnosis not present

## 2022-09-30 DIAGNOSIS — R7989 Other specified abnormal findings of blood chemistry: Secondary | ICD-10-CM | POA: Diagnosis not present

## 2022-09-30 DIAGNOSIS — K219 Gastro-esophageal reflux disease without esophagitis: Secondary | ICD-10-CM | POA: Diagnosis not present

## 2022-09-30 DIAGNOSIS — E039 Hypothyroidism, unspecified: Secondary | ICD-10-CM | POA: Diagnosis not present

## 2022-09-30 DIAGNOSIS — E1042 Type 1 diabetes mellitus with diabetic polyneuropathy: Secondary | ICD-10-CM | POA: Diagnosis not present

## 2022-10-05 NOTE — Progress Notes (Deleted)
10/05/2022 [name] [id number] 06/26/1985  Referring provider: Adrian Prince, MD Primary GI doctor: Dr. Rhea Belton  ASSESSMENT AND PLAN:   There are no diagnoses linked to this encounter.   Patient Care Team: Adrian Prince, MD as PCP - General (Endocrinology)  HISTORY OF PRESENT ILLNESS: 37 y.o. male with a past medical history of Type 1 DM diagnosed around age 70, hypothyroidism, GERD, celiac disease and others listed below presents for evaluation of ***.   2024 saw Dr. Rhea Belton in the office for bloating, abdominal discomfort GERD with worsening constipation. TTG 4 at that time and borderline positive 07/26/2019 EGD Dr. Rhea Belton normal esophagus, erythematous mucosa in the antrum, scalloped mucosa in the duodenum suspicious for celiac, normal second portion of duodenum multiple biopsies of esophagus. Biopsies confirmed celiac disease. Patient following gluten-free diet.  06/05/2022 ER visit for nausea, vomiting, diarrhea and hypoglycemia. WBC 17.6, Hgb 16.2, platelets 280, normal kidney liver negative respiratory panel Patient responded to antiemetics and IVF and discharged home.  Patient {Actions; denies-reports:120008} family history of colon cancer or other gastrointestinal malignancies.   He {Actions; denies-reports:120008} blood thinner use.  He {Actions; denies-reports:120008} NSAID use.  He {Actions; denies-reports:120008} ETOH use.   He {Actions; denies-reports:120008} tobacco use.  He {Actions; denies-reports:120008} drug use.    He  reports that he has been smoking cigarettes. He has a 10.00 pack-year smoking history. He has never used smokeless tobacco. He reports that he does not currently use alcohol. He reports that he does not use drugs.  RELEVANT LABS AND IMAGING: CBC    Component Value Date/Time   WBC 17.6 (H) 06/05/2022 2117   RBC 5.56 06/05/2022 2117   HGB 16.2 06/05/2022 2117   HCT 46.3 06/05/2022 2117   PLT 280 06/05/2022 2117   MCV 83.3  06/05/2022 2117   MCH 29.1 06/05/2022 2117   MCHC 35.0 06/05/2022 2117   RDW 12.1 06/05/2022 2117   LYMPHSABS 0.4 (L) 06/05/2022 2117   MONOABS 0.9 06/05/2022 2117   EOSABS 0.4 06/05/2022 2117   BASOSABS 0.1 06/05/2022 2117   Recent Labs    06/05/22 2117  HGB 16.2    CMP     Component Value Date/Time   NA 136 06/05/2022 2117   K 3.8 06/05/2022 2117   CL 100 06/05/2022 2117   CO2 27 06/05/2022 2117   GLUCOSE 162 (H) 06/05/2022 2117   BUN 12 06/05/2022 2117   CREATININE 0.92 06/05/2022 2117   CALCIUM 9.2 06/05/2022 2117   PROT 8.0 06/05/2022 2117   ALBUMIN 4.6 06/05/2022 2117   AST 23 06/05/2022 2117   ALT 26 06/05/2022 2117   ALKPHOS 96 06/05/2022 2117   BILITOT 0.9 06/05/2022 2117   GFRNONAA >60 06/05/2022 2117   GFRAA  05/17/2008 1610    >60        The eGFR has been calculated using the MDRD equation. This calculation has not been validated in all clinical situations. eGFR's persistently <60 mL/min signify possible Chronic Kidney Disease.      Latest Ref Rng & Units 06/05/2022    9:17 PM  Hepatic Function  Total Protein 6.5 - 8.1 g/dL 8.0   Albumin 3.5 - 5.0 g/dL 4.6   AST 15 - 41 U/L 23   ALT 0 - 44 U/L 26   Alk Phosphatase 38 - 126 U/L 96   Total Bilirubin 0.3 - 1.2 mg/dL 0.9       Current Medications:   Current Outpatient Medications (Endocrine & Metabolic):  insulin aspart (NOVOLOG) 100 UNIT/ML injection, Inject 200 Units into the skin See admin instructions. 200 units into pump   insulin lispro (HUMALOG) 100 UNIT/ML injection, USE IN OMNIPOD DAILY (CHO 10, ISF 35, TARGET 120) USE 100 UNITS MAX DAILY DOSE OR INCREASE AS DIRECTED BY MD.   levothyroxine (SYNTHROID) 100 MCG tablet, Take 1 tablet (100 mcg total) by mouth daily on an empty stomach 30 minutes before a meal  Current Outpatient Medications (Cardiovascular):    rosuvastatin (CRESTOR) 5 MG tablet, Take 1 tablet (5 mg total) by mouth daily.  Current Outpatient Medications (Respiratory):     albuterol (VENTOLIN HFA) 108 (90 Base) MCG/ACT inhaler, Inhale 1-2 puffs into the lungs every 4 (four) hours as needed for wheezing or shortness of breath.    Current Outpatient Medications (Other):    buPROPion (WELLBUTRIN XL) 150 MG 24 hr tablet, Take 1 tablet (150 mg total) by mouth in the morning.   buPROPion (WELLBUTRIN) 75 MG tablet, Take 75 mg by mouth at bedtime. (Patient not taking: Reported on 07/18/2021)   busPIRone (BUSPAR) 10 MG tablet, Take 1 tablet (10 mg total) by mouth 2 (two) times daily as needed.   busPIRone (BUSPAR) 10 MG tablet, Take 1 tablet (10 mg total) by mouth 2 (two) times daily as needed.   DULoxetine (CYMBALTA) 60 MG capsule, Take 1 capsule (60 mg total) by mouth daily.   DULoxetine (CYMBALTA) 60 MG capsule, Take 1 capsule (60 mg total) by mouth daily.   escitalopram (LEXAPRO) 10 MG tablet, Start with 1/2 tablet (5 mg total) daily for 7 days then increase to 1 tablet (10 mg total) daily.   escitalopram (LEXAPRO) 10 MG tablet, Start with 1/2 tablet daily for 7 days then increase to 1 tablet (10 mg total) by mouth daily.   Insulin Disposable Pump (OMNIPOD DASH SYSTEM) KIT, by Does not apply route.   ketoconazole (NIZORAL) 2 % cream, Apply 1 application externally between toes 14 days   omeprazole (PRILOSEC) 40 MG capsule, TAKE 1 CAPSULE BY MOUTH ONCE DAILY   ondansetron (ZOFRAN) 4 MG tablet, Take 1 tablet (4 mg total) by mouth every 8 (eight) hours as needed for nausea or vomiting.  Medical History:  Past Medical History:  Diagnosis Date   Anxiety    Atypical chest pain 04/2017   w/SOB. Pain radiates down left arm at times. Fam Hx of heart disease   Celiac disease    CTS (carpal tunnel syndrome) 2009   (R)   Diabetes mellitus 05/1997   type I    DKA (diabetic ketoacidoses) 2003 & 2004   GERD (gastroesophageal reflux disease)    Hyperlipidemia    Hypothyroidism    Pilonidal cyst 2008   Seizure ([facility]) 2002   hypoglycemic   Seizures ([facility]) 1990, 1991    febrile seizures x 3   Allergies:  Allergies  Allergen Reactions   Codeine Other (See Comments)    Altered Mental status   Prednisone Other (See Comments)    Extreme hyperglycemia     Surgical History:  He  has a past surgical history that includes none and Tympanostomy tube placement (1990). Family History:  His family history includes Celiac disease in his mother; Heart disease in an other family member; High Cholesterol in his maternal grandfather and paternal grandfather; Hypertension in his maternal grandfather and paternal grandfather; Irritable bowel syndrome in his father.  REVIEW OF SYSTEMS  : All other systems reviewed and negative except where noted in the History of Present Illness.  PHYSICAL EXAM: There were no vitals taken for this visit. General Appearance: Well nourished, in no apparent distress. Head:   Normocephalic and atraumatic. Eyes:  sclerae anicteric,conjunctive pink  Respiratory: Respiratory effort normal, BS equal bilaterally without rales, rhonchi, wheezing. Cardio: RRR with no MRGs. Peripheral pulses intact.  Abdomen: Soft,  {BlankSingle:19197::"Flat","Obese","Non-distended"} ,active bowel sounds. {actendernessAB:27319} tenderness {anatomy; site abdomen:5010}. {BlankMultiple:19196::"Without guarding","With guarding","Without rebound","With rebound"}. No masses. Rectal: {acrectalexam:27461} Musculoskeletal: Full ROM, {PSY - GAIT AND STATION:22860} gait. {With/Without:304960234} edema. Skin:  Dry and intact without significant lesions or rashes Neuro: Alert and  oriented x4;  No focal deficits. Psych:  Cooperative. Normal mood and affect.    Doree Albee, PA-C 2:00 PM

## 2022-10-11 ENCOUNTER — Ambulatory Visit: Payer: Commercial Managed Care - PPO | Admitting: Physician Assistant

## 2022-10-12 DIAGNOSIS — G629 Polyneuropathy, unspecified: Secondary | ICD-10-CM | POA: Diagnosis not present

## 2022-10-12 DIAGNOSIS — F17210 Nicotine dependence, cigarettes, uncomplicated: Secondary | ICD-10-CM | POA: Diagnosis not present

## 2022-10-12 DIAGNOSIS — E1042 Type 1 diabetes mellitus with diabetic polyneuropathy: Secondary | ICD-10-CM | POA: Diagnosis not present

## 2022-10-12 DIAGNOSIS — E785 Hyperlipidemia, unspecified: Secondary | ICD-10-CM | POA: Diagnosis not present

## 2022-10-12 DIAGNOSIS — F329 Major depressive disorder, single episode, unspecified: Secondary | ICD-10-CM | POA: Diagnosis not present

## 2022-10-12 DIAGNOSIS — R82998 Other abnormal findings in urine: Secondary | ICD-10-CM | POA: Diagnosis not present

## 2022-10-12 DIAGNOSIS — Z0000 Encounter for general adult medical examination without abnormal findings: Secondary | ICD-10-CM | POA: Diagnosis not present

## 2022-10-12 DIAGNOSIS — E039 Hypothyroidism, unspecified: Secondary | ICD-10-CM | POA: Diagnosis not present

## 2022-10-12 DIAGNOSIS — Z4681 Encounter for fitting and adjustment of insulin pump: Secondary | ICD-10-CM | POA: Diagnosis not present

## 2022-10-12 DIAGNOSIS — E538 Deficiency of other specified B group vitamins: Secondary | ICD-10-CM | POA: Diagnosis not present

## 2022-10-12 DIAGNOSIS — E310 Autoimmune polyglandular failure: Secondary | ICD-10-CM | POA: Diagnosis not present

## 2022-11-04 ENCOUNTER — Other Ambulatory Visit (HOSPITAL_COMMUNITY): Payer: Self-pay

## 2022-11-04 MED ORDER — BUSPIRONE HCL 10 MG PO TABS
10.0000 mg | ORAL_TABLET | Freq: Two times a day (BID) | ORAL | 2 refills | Status: DC | PRN
  Filled 2022-11-04: qty 60, 30d supply, fill #0

## 2022-11-16 ENCOUNTER — Other Ambulatory Visit (HOSPITAL_COMMUNITY): Payer: Self-pay

## 2022-11-16 MED ORDER — OMNIPOD 5 DEXG7G6 PODS GEN 5 MISC
3 refills | Status: AC
  Filled 2022-11-16: qty 15, 30d supply, fill #0
  Filled 2023-01-02: qty 15, 30d supply, fill #1
  Filled 2023-02-16: qty 15, 30d supply, fill #2
  Filled 2023-03-21: qty 15, 30d supply, fill #3

## 2022-11-17 ENCOUNTER — Other Ambulatory Visit (HOSPITAL_COMMUNITY): Payer: Self-pay

## 2022-11-18 ENCOUNTER — Other Ambulatory Visit (HOSPITAL_COMMUNITY): Payer: Self-pay

## 2022-11-18 MED ORDER — OMNIPOD 5 DEXG7G6 PODS GEN 5 MISC
3 refills | Status: AC
  Filled 2022-11-18: qty 45, 90d supply, fill #0

## 2022-12-13 ENCOUNTER — Other Ambulatory Visit: Payer: Self-pay

## 2022-12-13 ENCOUNTER — Other Ambulatory Visit (HOSPITAL_COMMUNITY): Payer: Self-pay

## 2022-12-13 MED ORDER — BUSPIRONE HCL 10 MG PO TABS
10.0000 mg | ORAL_TABLET | Freq: Two times a day (BID) | ORAL | 2 refills | Status: DC | PRN
  Filled 2022-12-13: qty 60, 30d supply, fill #0

## 2022-12-14 ENCOUNTER — Other Ambulatory Visit (HOSPITAL_COMMUNITY): Payer: Self-pay

## 2023-01-02 ENCOUNTER — Other Ambulatory Visit (HOSPITAL_COMMUNITY): Payer: Self-pay

## 2023-01-09 ENCOUNTER — Other Ambulatory Visit (HOSPITAL_COMMUNITY): Payer: Self-pay

## 2023-01-12 ENCOUNTER — Other Ambulatory Visit (HOSPITAL_COMMUNITY): Payer: Self-pay

## 2023-01-30 ENCOUNTER — Other Ambulatory Visit (HOSPITAL_COMMUNITY): Payer: Self-pay

## 2023-01-30 MED ORDER — INSULIN LISPRO 100 UNIT/ML IJ SOLN
100.0000 [IU] | Freq: Every day | INTRAMUSCULAR | 3 refills | Status: AC
  Filled 2023-01-30: qty 30, 30d supply, fill #0
  Filled 2023-03-21: qty 30, 30d supply, fill #1

## 2023-02-16 ENCOUNTER — Other Ambulatory Visit (HOSPITAL_COMMUNITY): Payer: Self-pay

## 2023-02-17 DIAGNOSIS — F329 Major depressive disorder, single episode, unspecified: Secondary | ICD-10-CM | POA: Diagnosis not present

## 2023-02-17 DIAGNOSIS — E785 Hyperlipidemia, unspecified: Secondary | ICD-10-CM | POA: Diagnosis not present

## 2023-02-17 DIAGNOSIS — E310 Autoimmune polyglandular failure: Secondary | ICD-10-CM | POA: Diagnosis not present

## 2023-02-17 DIAGNOSIS — G629 Polyneuropathy, unspecified: Secondary | ICD-10-CM | POA: Diagnosis not present

## 2023-02-17 DIAGNOSIS — K219 Gastro-esophageal reflux disease without esophagitis: Secondary | ICD-10-CM | POA: Diagnosis not present

## 2023-02-17 DIAGNOSIS — F17210 Nicotine dependence, cigarettes, uncomplicated: Secondary | ICD-10-CM | POA: Diagnosis not present

## 2023-02-17 DIAGNOSIS — E538 Deficiency of other specified B group vitamins: Secondary | ICD-10-CM | POA: Diagnosis not present

## 2023-02-17 DIAGNOSIS — G4730 Sleep apnea, unspecified: Secondary | ICD-10-CM | POA: Diagnosis not present

## 2023-02-17 DIAGNOSIS — E039 Hypothyroidism, unspecified: Secondary | ICD-10-CM | POA: Diagnosis not present

## 2023-02-17 DIAGNOSIS — E1042 Type 1 diabetes mellitus with diabetic polyneuropathy: Secondary | ICD-10-CM | POA: Diagnosis not present

## 2023-03-21 ENCOUNTER — Other Ambulatory Visit (HOSPITAL_COMMUNITY): Payer: Self-pay

## 2023-03-21 ENCOUNTER — Other Ambulatory Visit: Payer: Self-pay

## 2023-03-21 MED ORDER — DULOXETINE HCL 60 MG PO CPEP
60.0000 mg | ORAL_CAPSULE | Freq: Every day | ORAL | 3 refills | Status: AC
  Filled 2023-03-21: qty 90, 90d supply, fill #0

## 2023-04-04 ENCOUNTER — Other Ambulatory Visit (HOSPITAL_COMMUNITY): Payer: Self-pay

## 2023-04-04 MED ORDER — BUSPIRONE HCL 10 MG PO TABS
10.0000 mg | ORAL_TABLET | Freq: Two times a day (BID) | ORAL | 2 refills | Status: DC | PRN
  Filled 2023-04-04: qty 60, 30d supply, fill #0

## 2023-04-04 MED ORDER — INSULIN LISPRO 100 UNIT/ML IJ SOLN: 100.0000 [IU] | Freq: Every day | INTRAMUSCULAR | 3 refills | Status: DC

## 2023-04-04 MED ORDER — OMNIPOD 5 DEXG7G6 PODS GEN 5 MISC
1.0000 | 3 refills | Status: AC
  Filled 2023-04-04: qty 45, 90d supply, fill #0

## 2023-04-11 ENCOUNTER — Other Ambulatory Visit (HOSPITAL_COMMUNITY): Payer: Self-pay

## 2023-04-14 ENCOUNTER — Other Ambulatory Visit (HOSPITAL_COMMUNITY): Payer: Self-pay

## 2023-05-31 DIAGNOSIS — Z4681 Encounter for fitting and adjustment of insulin pump: Secondary | ICD-10-CM | POA: Diagnosis not present

## 2023-05-31 DIAGNOSIS — F419 Anxiety disorder, unspecified: Secondary | ICD-10-CM | POA: Diagnosis not present

## 2023-05-31 DIAGNOSIS — Z794 Long term (current) use of insulin: Secondary | ICD-10-CM | POA: Diagnosis not present

## 2023-05-31 DIAGNOSIS — E785 Hyperlipidemia, unspecified: Secondary | ICD-10-CM | POA: Diagnosis not present

## 2023-05-31 DIAGNOSIS — E1042 Type 1 diabetes mellitus with diabetic polyneuropathy: Secondary | ICD-10-CM | POA: Diagnosis not present

## 2023-06-05 NOTE — Progress Notes (Unsigned)
 Psychiatric Initial Adult Assessment   Patient Identification: FORTINO HAAG MRN:  [id number] Date of Evaluation:  06/07/2023 Referral Source: Laurene Footman MD Chief Complaint:   Chief Complaint  Patient presents with   Establish Care   Anxiety   Attention and concentration deficit   Visit Diagnosis:    ICD-10-CM   1. GAD (generalized anxiety disorder)  F41.1 escitalopram (LEXAPRO) 10 MG tablet    propranolol (INDERAL) 10 MG tablet    2. Attention and concentration deficit  R41.840 buPROPion ER (WELLBUTRIN SR) 100 MG 12 hr tablet    Ambulatory referral to Psychology      History of Present Illness:  NIHAAL FRIESEN is a 38 year old Caucasian male, currently separated from wife, going through a divorce, employed, lives in [place], has a history of anxiety, attention and focus problems, diabetes mellitus type 1, gastroesophageal reflux disease, hypothyroidism, history of prior seizures as a child, hyperlipidemia was evaluated in office today, presented to establish care.  He experiences anxiety and jitteriness, describing a sensation of needing to 'get up and do something' and feeling hyperactive. These symptoms have persisted for approximately 19 years. He previously worked as a Marine scientist and often felt the need to get out and walk around due to restlessness. The jitteriness continues to affect him on some days, impacting his ability to balance work and family responsibilities.  He is concerned about having ADHD, noting difficulties with attention and hyperactivity since childhood. He recalls struggling in school, particularly with subjects like algebra, and describes himself as a 'hands-on person' who learns by doing. He was never tested for ADHD during his school years.  He experiences panic attacks approximately once every one to two weeks, with symptoms including chest tightness and a sensation of an 'elephant sitting on my chest'. He describes feeling like his mind  is telling him to 'go, go, go' while his body feels exhausted. His brother, who has similar experiences, identified these episodes as panic attacks.  This has been going on for a while.  He takes BuSpar as needed when he feels shaky or jittery and that seems to help.  He is currently taking Cymbalta 60 mg daily at night, escitalopram 10 mg daily, and buspirone 10 mg as needed, usually once at night and occasionally during the day if feeling jittery. He has not been taking Wellbutrin as it was not filled by the pharmacy, and his wife advised against it due to potential interactions.  He reports appetite is poor.  Mostly because he does not have time to cook and his grabs water is available.  Patient denies any manic or hypomanic symptoms.  Denies any obsessions or compulsive behaviors.  Patient denies any history of trauma.  Patient is currently going through multiple situational stressors.  Going through a divorce with his wife.  They have been separated.  Patient has custody of his 15 year old son and they live together.  His mother helps homeschool his child.  He also has shared custody of his other 3 children.  Patient agreeable to referral for psychotherapy.  Patient denies any suicidality, homicidality or perceptual disturbances.   Associated Signs/Symptoms: Depression Symptoms:  decreased appetite, (Hypo) Manic Symptoms:   Denies Anxiety Symptoms:  Excessive Worry, Panic Symptoms, Psychotic Symptoms:   Denies PTSD Symptoms: Negative  Past Psychiatric History: Patient denied inpatient behavioral health admissions.  Denies suicide attempts.  Patient denies self-injurious behaviors.  Previous Psychotropic Medications: Yes past trials of medications like Wellbutrin-noncompliant, Lexapro, BuSpar, Cymbalta  Substance Abuse  History in the last 12 months:  No.  Consequences of Substance Abuse: Negative  Past Medical History:  Past Medical History:  Diagnosis Date   Anxiety     Atypical chest pain 04/2017   w/SOB. Pain radiates down left arm at times. Fam Hx of heart disease   Celiac disease    CTS (carpal tunnel syndrome) 2009   (R)   Diabetes mellitus 05/1997   type I    DKA (diabetic ketoacidoses) 2003 & 2004   GERD (gastroesophageal reflux disease)    Hyperlipidemia    Hypothyroidism    Pilonidal cyst 2008   Seizure (HCC) 2002   hypoglycemic   Seizures (HCC) 1990, 1991   febrile seizures x 3    Past Surgical History:  Procedure Laterality Date   none     TYMPANOSTOMY TUBE PLACEMENT  1990    Family Psychiatric History: Denies any history of mental health problems in his family.  Denies suicide attempts in his family.  Denies substance abuse in his family.  Family History:  Family History  Problem Relation Age of Onset   Celiac disease Mother    Irritable bowel syndrome Father    Hypertension Maternal Grandfather    High Cholesterol Maternal Grandfather    Hypertension Paternal Grandfather    High Cholesterol Paternal Grandfather    Heart disease Other    Colon cancer Neg Hx    Colon polyps Neg Hx    Esophageal cancer Neg Hx    Rectal cancer Neg Hx    Stomach cancer Neg Hx    Mental illness Neg Hx     Social History:   Social History   Socioeconomic History   Marital status: Legally Separated    Spouse name: Not on file   Number of children: 4   Years of education: Not on file   Highest education level: GED or equivalent  Occupational History   Occupation: Holiday representative  Tobacco Use   Smoking status: Every Day    Current packs/day: 1.00    Average packs/day: 1 pack/day for 10.0 years (10.0 ttl pk-yrs)    Types: Cigarettes   Smokeless tobacco: Never  Vaping Use   Vaping status: Never Used  Substance and Sexual Activity   Alcohol use: Yes    Comment: occasional   Drug use: No   Sexual activity: Not Currently  Other Topics Concern   Not on file  Social History Narrative   Not on file   Social Drivers of Health    Financial Resource Strain: Not on file  Food Insecurity: Not on file  Transportation Needs: Not on file  Physical Activity: Not on file  Stress: Not on file  Social Connections: Not on file    Additional Social History: Patient was born and raised in [place] by both parents.  Reports he had a normal childhood.  He went up to 11th grade.  Reports he could not focus and did not like school.  He was able to go out and get his GED later on.  Patient has 1 brother.  He reports an okay relationship.  Married twice.  Divorced once.  Currently going through divorce with his second wife.  He has 4 children altogether 2 boys and 2 girls age between 2-13.  His 25 year old son lives with him.  He shares custody of his other children.  Patient denies any legal problems.  Reports he is religious.  Does have access to guns through his mother.  Safely locked away.  Denies being in [employer].  Allergies:   Allergies  Allergen Reactions   Codeine Other (See Comments)    Altered Mental status   Prednisone Other (See Comments)    Extreme hyperglycemia    Metabolic Disorder Labs: Lab Results  Component Value Date   HGBA1C 9.3 03/07/2016   No results found for: "PROLACTIN" No results found for: "CHOL", "TRIG", "HDL", "CHOLHDL", "VLDL", "LDLCALC" No results found for: "TSH"  Therapeutic Level Labs: No results found for: "LITHIUM" No results found for: "CBMZ" No results found for: "VALPROATE"  Current Medications: Current Outpatient Medications  Medication Sig Dispense Refill   Blood Glucose Monitoring Suppl (ONETOUCH VERIO FLEX SYSTEM) w/Device KIT See admin instructions.     buPROPion ER (WELLBUTRIN SR) 100 MG 12 hr tablet Take 1 tablet (100 mg total) by mouth in the morning. Stop Bupropion XL 150 mg 30 tablet 1   busPIRone (BUSPAR) 10 MG tablet Take 1 tablet (10 mg total) by mouth 2 (two) times daily as needed. 60 tablet 2   DULoxetine (CYMBALTA) 60 MG capsule Take 1 capsule (60 mg total) by  mouth daily. 90 capsule 3   insulin aspart (NOVOLOG) 100 UNIT/ML injection Inject 200 Units into the skin See admin instructions. 200 units into pump     Insulin Disposable Pump (OMNIPOD 5 DEXG7G6 PODS GEN 5) MISC Apply Pod and use to deliver insulin for 48 hours, then remove and replace 45 each 3   Insulin Disposable Pump (OMNIPOD 5 DEXG7G6 PODS GEN 5) MISC Change every 48 hours, to deliver insulin continuously 45 each 3   Insulin Disposable Pump (OMNIPOD 5 G6 PODS, GEN 5,) MISC Use to deliver insulin continuously, changing every 48 hours. 45 each 3   Insulin Disposable Pump (OMNIPOD DASH SYSTEM) KIT by Does not apply route.     insulin lispro (HUMALOG) 100 UNIT/ML injection Use in omnipod daily  (CHO 10, ISF 35, TARGET 120). Max daily dose 100 units or increase as directed by MD. 40 mL 3   levothyroxine (SYNTHROID) 100 MCG tablet Take 1 tablet (100 mcg total) by mouth daily on an empty stomach 30 minutes before a meal 90 tablet 3   omeprazole (PRILOSEC) 40 MG capsule TAKE 1 CAPSULE BY MOUTH ONCE DAILY 90 capsule 4   propranolol (INDERAL) 10 MG tablet Take 1 tablet (10 mg total) by mouth 2 (two) times daily as needed. For severe anxiety only 60 tablet 1   rosuvastatin (CRESTOR) 5 MG tablet Take 1 tablet (5 mg total) by mouth daily. 90 tablet 3   Continuous Glucose Sensor (DEXCOM G6 SENSOR) MISC change every 10 days for 90 days     escitalopram (LEXAPRO) 10 MG tablet Take 0.5 tablets (5 mg total) by mouth daily for 7 days. Take for 7 days and stop     No current facility-administered medications for this visit.    Musculoskeletal: Strength & Muscle Tone: within normal limits Gait & Station: normal Patient leans: N/A  Psychiatric Specialty Exam: Review of Systems  Psychiatric/Behavioral:  Positive for decreased concentration. The patient is nervous/anxious.     Blood pressure 122/76, pulse 86, temperature 98.4 F (36.9 C), temperature source Temporal, height 5\' 7"  (1.702 m), weight 177 lb  (80.3 kg), SpO2 99%.Body mass index is 27.72 kg/m.  General Appearance: Casual  Eye Contact:  Good  Speech:  Clear and Coherent  Volume:  Normal  Mood:  Anxious  Affect:  Congruent  Thought Process:  Goal Directed and Descriptions of Associations: Intact  Orientation:  Full (Time, Place, and Person)  Thought Content:  Logical  Suicidal Thoughts:  No  Homicidal Thoughts:  No  Memory:  Immediate;   Fair Recent;   Fair Remote;   Fair  Judgement:  Fair  Insight:  Fair  Psychomotor Activity:  Normal  Concentration:  Concentration: Fair and Attention Span: Fair  Recall:  [employer] of Knowledge:Fair  Language: Fair  Akathisia:  No  Handed:  Right  AIMS (if indicated):  not done  Assets:  Desire for Improvement Housing Social Support Transportation  ADL's:  Intact  Cognition: WNL  Sleep:  Fair   Screenings: GAD-7    Flowsheet Row Office Visit from 06/07/2023 in [facility]  Total GAD-7 Score 14      PHQ2-9    Flowsheet Row Office Visit from 06/07/2023 in [facility]  PHQ-2 Total Score 0      Flowsheet Row Office Visit from 06/07/2023 in [facility] from 06[facility]05/2022 in [facility] at [facility] ED from 08/28/2021 in [facility])  C-SSRS RISK CATEGORY No Risk No Risk No Risk       Assessment and Plan: TERIUS JACUINDE is a 38 year old Caucasian male with history of anxiety, attention and focus problems, presented to establish care.  Discussed assessment and plan as noted below.  Anxiety Disorder-unstable Kenechukwu reports significant anxiety, jitteriness, and weekly panic attacks with symptoms including chest tightness and feeling overwhelmed. Current medications: Cymbalta 60 mg daily, Buspar 10 mg twice daily as needed, Lexapro 10 mg daily. Advised to taper off Lexapro due to  potential interactions with Cymbalta. Discussed propranolol for severe anxiety symptoms, highlighting its non-drowsy effect and potential side effects like bradycardia and hypotension. - Taper off Lexapro: Take 5 mg daily for one week, then discontinue - Prescribe Propranolol 10 mg  as needed for severe anxiety symptoms, up to twice daily - Refer to therapist for ongoing anxiety management-I have reached out to therapist at [facility], [place].  Attention-Deficit/Hyperactivity Disorder (ADHD) - Suspected Wilbern reports long-standing hyperactivity, difficulty focusing, and jitteriness since childhood, affecting daily functioning. Scored high on ADHD questionnaire. Formal testing required to confirm diagnosis. Discussed Wellbutrin SR for ADHD symptoms and its dual role in managing mood and attention. Advised to take Wellbutrin in the morning to avoid sleep disturbances. Informed about wait time and potential out-of-pocket costs for ADHD testing. - Refer to [facility] for ADHD testing - I have also sent a referral to [facility], Dr. Helmut Muster - Prescribe Wellbutrin SR 100 mg daily in the morning, advised to stop Wellbutrin XL 150 mg daily.(He has not picked that prescription from pharmacy per her report) - Provide contact information for [facility] and advise to schedule an appointment  I have reviewed notes per Dr. Evlyn Kanner -primary care patient with anxiety and depression-on medications like Wellbutrin, Cymbalta and BuSpar as needed.  I have reviewed labs-TSH dated 09/30/2018 24-1.38.  Hemoglobin A1c-dated 09/30/2018 24-9.1-elevated.  Patient agreed to follow up with primary care provider for management of diabetes.   Follow-up - Schedule follow-up appointment in four weeks for medication management and progress assessment - Check out at the front desk and schedule the next appointment.   Collaboration of Care: {BH OP Collaboration of  Care:[id number]}  Patient/Guardian was advised Release of Information must be obtained prior to any record release in order to collaborate their care with  an outside provider. Patient/Guardian was advised if they have not already done so to contact the registration department to sign all necessary forms in order for [place] to release information regarding their care.   Consent: Patient/Guardian gives verbal consent for treatment and assignment of benefits for services provided during this visit. Patient/Guardian expressed understanding and agreed to proceed.   Jomarie Longs, MD 3/5/202511:00 AM

## 2023-06-07 ENCOUNTER — Ambulatory Visit (INDEPENDENT_AMBULATORY_CARE_PROVIDER_SITE_OTHER): Payer: Commercial Managed Care - PPO | Admitting: Psychiatry

## 2023-06-07 ENCOUNTER — Encounter: Payer: Self-pay | Admitting: Psychiatry

## 2023-06-07 VITALS — BP 122/76 | HR 86 | Temp 98.4°F | Ht 67.0 in | Wt 177.0 lb

## 2023-06-07 DIAGNOSIS — R41840 Attention and concentration deficit: Secondary | ICD-10-CM

## 2023-06-07 DIAGNOSIS — F411 Generalized anxiety disorder: Secondary | ICD-10-CM | POA: Diagnosis not present

## 2023-06-07 MED ORDER — BUPROPION HCL ER (SR) 100 MG PO TB12: 100.0000 mg | ORAL_TABLET | Freq: Every morning | ORAL | 1 refills | Status: DC

## 2023-06-07 MED ORDER — ESCITALOPRAM OXALATE 10 MG PO TABS: 5.0000 mg | ORAL_TABLET | Freq: Every day | ORAL | Status: DC

## 2023-06-07 MED ORDER — PROPRANOLOL HCL 10 MG PO TABS: 10.0000 mg | ORAL_TABLET | Freq: Two times a day (BID) | ORAL | 1 refills | Status: DC | PRN

## 2023-06-07 NOTE — Patient Instructions (Addendum)
 [place] - Call: [phone]  Propranolol Tablets What is this medication? PROPRANOLOL (proe PRAN oh lole) treats many conditions such as high blood pressure, tremors, and a type of arrhythmia known as AFib (atrial fibrillation). It works by lowering your blood pressure and heart rate, making it easier for your heart to pump blood to the rest of your body. It may be used to prevent migraine headaches. It works by relaxing the blood vessels in the brain that cause migraines. It belongs to a group of medications called beta blockers. This medicine may be used for other purposes; ask your health care provider or pharmacist if you have questions. COMMON BRAND NAME(S): Inderal What should I tell my care team before I take this medication? They need to know if you have any of these conditions: Diabetes Having surgery Heart or blood vessel conditions, such as slow heartbeat, heart failure, heart block Kidney disease Liver disease Lung or breathing disease, such as asthma or COPD Myasthenia gravis Pheochromocytoma Thyroid disease An unusual or allergic reaction to propranolol, other medications, foods, dyes, or preservatives Pregnant or trying to get pregnant Breastfeeding How should I use this medication? Take this medication by mouth. Take it as directed on the prescription label at the same time every day. Keep taking it unless your care team tells you to stop. Talk to your care team about the use of this medication in children. Special care may be needed. Overdosage: If you think you have taken too much of this medicine contact a poison control center or emergency room at once. NOTE: This medicine is only for you. Do not share this medicine with others. What if I miss a dose? If you miss a dose, take it as soon as you can. If it is almost time for your next dose, take only that dose. Do not take double or extra doses. What may interact with this medication? Do not  take this medication with any of the following: Thioridazine This medication may also interact with the following: Certain medications for blood pressure, heart disease, irregular heartbeat Epinephrine NSAIDs, medications for pain and inflammation, such as ibuprofen or naproxen Warfarin Other medications may affect the way this medication works. Talk with your care team about all of the medications you take. They may suggest changes to your treatment plan to lower the risk of side effects and to make sure your medications work as intended. This list may not describe all possible interactions. Give your health care provider a list of all the medicines, herbs, non-prescription drugs, or dietary supplements you use. Also tell them if you smoke, drink alcohol, or use illegal drugs. Some items may interact with your medicine. What should I watch for while using this medication? Visit your care team for regular checks on your progress. Check your blood pressure as directed. Know what your blood pressure should be and when to contact your care team. This medication may affect your coordination, reaction time, or judgment. Do not drive or operate machinery until you know how this medication affects you. Sit up or stand slowly to reduce the risk of dizzy or fainting spells. Drinking alcohol with this medication can increase the risk of these side effects. Do not suddenly stop taking this medication. This may increase your risk of side effects, such as chest pain and heart attack. If you no longer need to take this medication, your care team will lower the dose slowly over time to decrease the risk of side effects. If  you are going to need surgery or a procedure, tell your care team that you are using this medication. This medication may affect blood glucose levels. It can also mask the symptoms of low blood sugar, such as a rapid heartbeat and tremors. If you have diabetes, it is important to check your blood  sugar often while you are taking this medication. Do not treat yourself for coughs, colds, or pain while you are using this medication without asking your care team for advice. Some medications may increase your blood pressure. What side effects may I notice from receiving this medication? Side effects that you should report to your care team as soon as possible: Allergic reactions--skin rash, itching, hives, swelling of the face, lips, tongue, or throat Heart failure--shortness of breath, swelling of the ankles, feet, or hands, sudden weight gain, unusual weakness or fatigue Low blood pressure--dizziness, feeling faint or lightheaded, blurry vision Raynaud's--cool, numb, or painful fingers or toes that may change color from pale, to blue, to red Redness, blistering, peeling, or loosening of the skin, including inside the mouth Slow heartbeat--dizziness, feeling faint or lightheaded, confusion, trouble breathing, unusual weakness or fatigue Worsening mood, feelings of depression Side effects that usually do not require medical attention (report to your care team if they continue or are bothersome): Change in sex drive or performance Diarrhea Dizziness Fatigue Headache This list may not describe all possible side effects. Call your doctor for medical advice about side effects. You may report side effects to FDA at 1-800-FDA-1088. Where should I keep my medication? Keep out of the reach of children and pets. Store at room temperature between 20 and 25 degrees C (68 and 77 degrees F). Protect from light. Throw away any unused medication after the expiration date. NOTE: This sheet is a summary. It may not cover all possible information. If you have questions about this medicine, talk to your doctor, pharmacist, or health care provider.  2024 Elsevier/Gold Standard (2022-03-21 00:00:00)

## 2023-06-26 ENCOUNTER — Ambulatory Visit

## 2023-06-29 DIAGNOSIS — H16042 Marginal corneal ulcer, left eye: Secondary | ICD-10-CM | POA: Diagnosis not present

## 2023-06-30 ENCOUNTER — Ambulatory Visit (HOSPITAL_COMMUNITY): Admitting: Clinical

## 2023-07-06 DIAGNOSIS — H16042 Marginal corneal ulcer, left eye: Secondary | ICD-10-CM | POA: Diagnosis not present

## 2023-07-12 ENCOUNTER — Telehealth: Payer: Self-pay

## 2023-07-12 NOTE — Telephone Encounter (Signed)
 pt mother left message that [name] is not doing well on medication. it "like he is in and out" is there something else he can take. pt last seen on 3-5 next appt 5-8

## 2023-07-12 NOTE — Telephone Encounter (Signed)
 Patient to be scheduled for an appointment tomorrow if they agree I have already communicated with staff.

## 2023-07-13 ENCOUNTER — Encounter: Payer: Self-pay | Admitting: Psychiatry

## 2023-07-13 ENCOUNTER — Ambulatory Visit (INDEPENDENT_AMBULATORY_CARE_PROVIDER_SITE_OTHER): Admitting: Psychiatry

## 2023-07-13 ENCOUNTER — Ambulatory Visit: Attending: Endocrinology

## 2023-07-13 VITALS — BP 122/78 | HR 77 | Temp 98.4°F | Ht 67.0 in | Wt 173.8 lb

## 2023-07-13 DIAGNOSIS — F411 Generalized anxiety disorder: Secondary | ICD-10-CM

## 2023-07-13 DIAGNOSIS — Z9189 Other specified personal risk factors, not elsewhere classified: Secondary | ICD-10-CM | POA: Diagnosis not present

## 2023-07-13 DIAGNOSIS — R41840 Attention and concentration deficit: Secondary | ICD-10-CM | POA: Diagnosis not present

## 2023-07-13 DIAGNOSIS — F32A Depression, unspecified: Secondary | ICD-10-CM | POA: Diagnosis not present

## 2023-07-13 MED ORDER — DULOXETINE HCL 20 MG PO CPEP: 20.0000 mg | ORAL_CAPSULE | Freq: Every day | ORAL | 1 refills | Status: DC

## 2023-07-13 NOTE — Progress Notes (Unsigned)
 BH MD OP Progress Note  07/13/2023 12:46 PM [name]  MRN:  [id number]  Chief Complaint:  Chief Complaint  Patient presents with   Follow-up   Anxiety   Depression   HPI: [name] is a 38 year old Caucasian male, currently separated from wife, going through a divorce, employed, lives in [place], has a history of GAD, depression unspecified, attention and focus deficit, diabetes mellitus type 1, gastroesophageal reflux disease, hypothyroidism, history of seizures as a child, hyperlipidemia was evaluated in office today for a follow-up appointment.  Collateral information obtained from mother-[name] who was present by phone.  Patient was also included in the phone conversation.  According to his mother,his moods are 'all over the place,' with fluctuations from day to day. She expressed concern about his mental health. She also mentioned his struggles with completing tasks and focusing, consistent with ADHD symptoms. 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  He stopped wellbutrin two weeks ago due to significant depressive symptoms and also stopped taking escitalopram on the advice of his ex-wife.He continues to take duloxetine 60 mg daily without side effects and is on propranolol as needed, though he has not used it yet. He is also on an insulin pump, Synthroid daily, and rosuvastatin for cholesterol.  He shares custody of his younger children and has a 79 year old living with him. He feels unappreciated and overwhelmed by his responsibilities, including managing his business and parenting. He notes that his 8 year old son does not respect him, which adds to his stress. He also mentioned past family business issues and the stress of his ongoing divorce.  He has not yet attended therapy despite being referred, as he missed his first appointment. He acknowledges the  importance of addressing his mental health to manage his life effectively.  He does have a history of attention and focus deficits and he was referred for ADHD testing.  He is still waiting to schedule an appointment for the same.  Patient currently denies any suicidality, homicidality or perceptual disturbances.  Reports he has had episodes of feeling overwhelmed recently however he has not had any thoughts about self-harm.  Visit Diagnosis:    ICD-10-CM   1. GAD (generalized anxiety disorder)  F41.1 DULoxetine (CYMBALTA) 20 MG capsule    2. Depression, unspecified depression type  F32.A DULoxetine (CYMBALTA) 20 MG capsule    3. At risk for prolonged QT interval syndrome  Z91.89 EKG 12-Lead    4. Attention and concentration deficit  R41.840       Past Psychiatric History: I have reviewed past psychiatric history from progress note on 06/07/2023.  Past trials of medications like Wellbutrin-side effect, Lexapro-side effect.  Past Medical History:  Past Medical History:  Diagnosis Date   Anxiety    Atypical chest pain 04/2017   w/SOB. Pain radiates down left arm at times. Fam Hx of heart disease   Celiac disease    CTS (carpal tunnel syndrome) 2009   (R)   Diabetes mellitus 05/1997   type I    DKA (diabetic ketoacidoses) 2003 & 2004   GERD (gastroesophageal reflux disease)    Hyperlipidemia    Hypothyroidism    Pilonidal cyst 2008   Seizure (HCC) 2002   hypoglycemic   Seizures (HCC) 1990, 1991   febrile seizures x 3    Past Surgical History:  Procedure Laterality Date   none     TYMPANOSTOMY TUBE  PLACEMENT  1990    Family Psychiatric History: I have reviewed family psychiatric history from progress note on 06/07/2023.  Family History:  Family History  Problem Relation Age of Onset   Celiac disease Mother    Irritable bowel syndrome Father    Hypertension Maternal Grandfather    High Cholesterol Maternal Grandfather    Hypertension Paternal Grandfather    High  Cholesterol Paternal Grandfather    Heart disease Other    Colon cancer Neg Hx    Colon polyps Neg Hx    Esophageal cancer Neg Hx    Rectal cancer Neg Hx    Stomach cancer Neg Hx    Mental illness Neg Hx     Social History: I have reviewed social history from progress note on 06/07/2023. Social History   Socioeconomic History   Marital status: Legally Separated    Spouse name: Not on file   Number of children: 4   Years of education: Not on file   Highest education level: GED or equivalent  Occupational History   Occupation: Holiday representative  Tobacco Use   Smoking status: Every Day    Current packs/day: 1.00    Average packs/day: 1 pack/day for 10.0 years (10.0 ttl pk-yrs)    Types: Cigarettes   Smokeless tobacco: Never  Vaping Use   Vaping status: Never Used  Substance and Sexual Activity   Alcohol use: Yes    Comment: occasional   Drug use: No   Sexual activity: Not Currently  Other Topics Concern   Not on file  Social History Narrative   Not on file   Social Drivers of Health   Financial Resource Strain: Not on file  Food Insecurity: Not on file  Transportation Needs: Not on file  Physical Activity: Not on file  Stress: Not on file  Social Connections: Not on file    Allergies:  Allergies  Allergen Reactions   Codeine Other (See Comments)    Altered Mental status   Prednisone Other (See Comments)    Extreme hyperglycemia    Metabolic Disorder Labs: Lab Results  Component Value Date   HGBA1C 9.3 03/07/2016   No results found for: "PROLACTIN" No results found for: "CHOL", "TRIG", "HDL", "CHOLHDL", "VLDL", "LDLCALC" No results found for: "TSH"  Therapeutic Level Labs: No results found for: "LITHIUM" No results found for: "VALPROATE" No results found for: "CBMZ"  Current Medications: Current Outpatient Medications  Medication Sig Dispense Refill   Blood Glucose Monitoring Suppl (ONETOUCH VERIO FLEX SYSTEM) w/Device KIT See admin instructions.      busPIRone (BUSPAR) 10 MG tablet Take 1 tablet (10 mg total) by mouth 2 (two) times daily as needed. 60 tablet 2   Continuous Glucose Sensor (DEXCOM G6 SENSOR) MISC change every 10 days for 90 days     Continuous Glucose Transmitter (DEXCOM G6 TRANSMITTER) MISC SMARTSIG:Every 3 Months     DULoxetine (CYMBALTA) 20 MG capsule Take 1 capsule (20 mg total) by mouth daily. Take along with 60 mg daily 30 capsule 1   DULoxetine (CYMBALTA) 60 MG capsule Take 1 capsule (60 mg total) by mouth daily. 90 capsule 3   insulin aspart (NOVOLOG) 100 UNIT/ML injection Inject 200 Units into the skin See admin instructions. 200 units into pump     Insulin Disposable Pump (OMNIPOD 5 DEXG7G6 PODS GEN 5) MISC Apply Pod and use to deliver insulin for 48 hours, then remove and replace 45 each 3   Insulin Disposable Pump (OMNIPOD 5 DEXG7G6 PODS GEN 5)  MISC Change every 48 hours, to deliver insulin continuously 45 each 3   Insulin Disposable Pump (OMNIPOD 5 G6 PODS, GEN 5,) MISC Use to deliver insulin continuously, changing every 48 hours. 45 each 3   Insulin Disposable Pump (OMNIPOD DASH SYSTEM) KIT by Does not apply route.     insulin lispro (HUMALOG) 100 UNIT/ML injection Use in omnipod daily  (CHO 10, ISF 35, TARGET 120). Max daily dose 100 units or increase as directed by MD. 40 mL 3   levothyroxine (SYNTHROID) 100 MCG tablet Take 1 tablet (100 mcg total) by mouth daily on an empty stomach 30 minutes before a meal 90 tablet 3   moxifloxacin (VIGAMOX) 0.5 % ophthalmic solution Place 1 drop into the left eye every 8 (eight) hours.     omeprazole (PRILOSEC) 40 MG capsule TAKE 1 CAPSULE BY MOUTH ONCE DAILY 90 capsule 4   ONETOUCH VERIO test strip 1 strip In Vitro four times daily for 30 days     propranolol (INDERAL) 10 MG tablet Take 1 tablet (10 mg total) by mouth 2 (two) times daily as needed. For severe anxiety only 60 tablet 1   rosuvastatin (CRESTOR) 5 MG tablet Take 1 tablet (5 mg total) by mouth daily. 90 tablet 3    No current facility-administered medications for this visit.     Musculoskeletal: Strength & Muscle Tone: within normal limits Gait & Station: normal Patient leans: N/A  Psychiatric Specialty Exam: Review of Systems  Psychiatric/Behavioral:  Positive for decreased concentration and dysphoric mood. The patient is nervous/anxious.     Blood pressure 122/78, pulse 77, temperature 98.4 F (36.9 C), temperature source Temporal, height 5\' 7"  (1.702 m), weight 173 lb 12.8 oz (78.8 kg), SpO2 100%.Body mass index is 27.22 kg/m.  General Appearance: Casual  Eye Contact:  Fair  Speech:  Clear and Coherent  Volume:  Normal  Mood:  Anxious and Depressed  Affect:  Congruent  Thought Process:  Goal Directed and Descriptions of Associations: Intact  Orientation:  Full (Time, Place, and Person)  Thought Content: Logical   Suicidal Thoughts:  No  Homicidal Thoughts:  No  Memory:  Immediate;   Fair Recent;   Fair Remote;   Fair  Judgement:  Fair  Insight:  Fair  Psychomotor Activity:  Normal  Concentration:  Concentration: Fair and Attention Span: Fair  Recall:  [employer] of Knowledge: Fair  Language: Fair  Akathisia:  No  Handed:  Right  AIMS (if indicated): not done  Assets:  Desire for Improvement Housing Social Support  ADL's:  Intact  Cognition: WNL  Sleep:  Fair   Screenings: GAD-7    Garment/textile technologist Visit from 07/13/2023 in [facility] Office Visit from 06/07/2023 in [facility]  Total GAD-7 Score 17 14      PHQ2-9    Flowsheet Row Office Visit from 07/13/2023 in [facility] Office Visit from 06/07/2023 in [facility]  PHQ-2 Total Score 2 0  PHQ-9 Total Score 8 --      Flowsheet Row Office Visit from 07/13/2023 in [facility] Office Visit from 06/07/2023 in [facility] ED from 06/05/2022 in [facility] at [facility]  C-SSRS RISK CATEGORY No Risk No Risk No Risk        Assessment and Plan: BRYCE CHEEVER is a 38 year old Caucasian male  with recent worsening of depression, anxiety, attention and focus problem was evaluated in office today, discussed assessment and plan as noted below.   Generalized Anxiety Disorder-unstable [name] is currently taking buspirone 10 mg twice daily without side effects and duloxetine 60 mg daily, which he tolerates well. Increasing duloxetine to 80 mg was discussed to enhance anxiety and depression symptom management. Potential benefits include improved symptoms, with risks including typical antidepressant side effects. - Increase Duloxetine to 80 mg daily by adding 20 mg to the current 60 mg dose - Continue Buspirone 10 mg twice daily - Continue Propranolol 10 mg twice daily as needed for severe anxiety symptoms. - Encourage therapy sessions at [facility] [place], patient was referred last visit I had communicated personally with staff to schedule this patient.  Depressive Symptoms-unstable [name] experiences depressive symptoms, particularly with Wellbutrin, leading to discontinuation due to severe depression. His mother reports mood fluctuations and concerning statements regarding self-harm few weeks ago, which he clarified as a misunderstanding. Starting aripiprazole was discussed, requiring an EKG due to potential cardiac side effects. He prefers to delay new medications as he currently feels fine. Risks of aripiprazole include weight gain, tremors, and abnormal movements, with benefits of mood stabilization and improved symptoms. - Hold off on starting Aripiprazole, consider if depression does not improve or get worse. - Encourage therapy sessions to address depressive symptoms - Monitor mood and safety; advise contacting emergency services if  suicidal thoughts occur - Crisis plan discussed with patient as well as mother. - Increase Duloxetine to 80 mg daily.  Rule out Attention-Deficit/Hyperactivity Disorder (ADHD) [name] exhibits attention and focus issues suggestive of ADHD. He has not completed ADHD testing, and the importance of evaluation was emphasized. He was advised to inquire about cost and payment options. - Provide information for ADHD testing with Dr. Grover Canavan - Encourage to contact the ADHD testing center to schedule an appointment  At risk for prolonged QT syndrome-ordered EKG.  Patient to call [phone] if he is interested in initiation of Aripiprazole as discussed.  Collateral information obtained from mother, [name].Mother agrees to monitor patient and get immediate help if in a crisis.   Collaboration of Care: Collaboration of Care: Referral or follow-up with counselor/therapist AEB patient encouraged to establish care with therapist.  Patient/Guardian was advised Release of Information must be obtained prior to any record release in order to collaborate their care with an outside provider. Patient/Guardian was advised if they have not already done so to contact the registration department to sign all necessary forms in order for [place] to release information regarding their care.   Consent: Patient/Guardian gives verbal consent for treatment and assignment of benefits for services provided during this visit. Patient/Guardian expressed understanding and agreed to proceed.   I have spent atleast 40 minutes face to face with patient today which includes the time spent for preparing to see the patient ( e.g., review of test, records ), obtaining and to review and separately obtained history , ordering medications and test ,psychoeducation and supportive psychotherapy and care coordination,as well as documenting clinical information in electronic health record. This note was generated in part or whole with voice  recognition software. Voice recognition is usually quite accurate but there are transcription errors that can and very often do occur. I apologize for any typographical errors that were not detected and corrected.    Jacob Longs, MD 07/14/2023, 7:35 AM

## 2023-07-13 NOTE — Patient Instructions (Addendum)
 Referred for ADHD testing -  Please call  [facility] [address] [place], [place] [address] [phone]   Please call for EKG - [phone]  Please contact [place] office for therapist appointment rescheduling since you missed you last appointment.    If you are experiencing a medical or mental health crisis, please call [phone], [phone], or go to your nearest emergency room for immediate assistance. You can also contact  [facility] crisis unit [phone].[facility] at [phone] to be assessed over the phone or in person.     Aripiprazole Tablets What is this medication? ARIPIPRAZOLE (ay ri PIP ray zole) treats schizophrenia, bipolar I disorder, autism spectrum disorder, and Tourette disorder. It may also be used with antidepressant medications to treat depression. It works by balancing the levels of dopamine and serotonin in the brain, hormones that help regulate mood, behaviors, and thoughts. It belongs to a group of medications called antipsychotics. Antipsychotics can be used to treat several kinds of mental health conditions. This medicine may be used for other purposes; ask your health care provider or pharmacist if you have questions. COMMON BRAND NAME(S): Abilify What should I tell my care team before I take this medication? They need to know if you have any of these conditions: Dementia Diabetes Difficulty swallowing Have trouble controlling your muscles Heart disease History of irregular heartbeat History of stroke Low blood cell levels (white cells, red cells, and platelets) Low blood pressure Parkinson disease Seizures Suicidal thoughts, plans, or attempt by you or a family member Urges to engage in impulsive behaviors in ways that are unusual for you An unusual or allergic reaction to aripiprazole, other medications, foods, dyes, or preservatives Pregnant or trying to get  pregnant Breastfeeding How should I use this medication? Take this medication by mouth with a glass of water. Take it as directed on the prescription label at the same time every day. You can take it with or without food. If it upsets your stomach, take it with food. Do not take your medication more often than directed. Keep taking it unless your care team tells you to stop. A special MedGuide will be given to you by the pharmacist with each prescription and refill. Be sure to read this information carefully each time. Talk to your care team about the use of this medication in children. While it may be prescribed for children as young as 6 years for selected conditions, precautions do apply. Overdosage: If you think you have taken too much of this medicine contact a poison control center or emergency room at once. NOTE: This medicine is only for you. Do not share this medicine with others. What if I miss a dose? If you miss a dose, take it as soon as you can. If it is almost time for your next dose, take only that dose. Do not take double or extra doses. What may interact with this medication? Do not take this medication with any of the following: Brexpiprazole Cisapride Dextromethorphan; quinidine Dronedarone Metoclopramide Pimozide Quinidine Thioridazine This medication may also interact with the following: Antihistamines for allergy, cough, and cold Carbamazepine Certain medications for anxiety or sleep Certain medications for depression, such as amitriptyline, fluoxetine, paroxetine, or sertraline Certain medications for fungal infections, such as fluconazole, itraconazole, ketoconazole, posaconazole, or voriconazole Clarithromycin General anesthetics, such as halothane, isoflurane, methoxyflurane, or propofol Medications for Parkinson disease, such as levodopa Medications for blood pressure Medications for seizures Medications that  relax muscles for surgery Opioid medications for  pain Other medications that cause heart rhythm changes Phenothiazines, such as chlorpromazine or prochlorperazine Rifampin This list may not describe all possible interactions. Give your health care provider a list of all the medicines, herbs, non-prescription drugs, or dietary supplements you use. Also tell them if you smoke, drink alcohol, or use illegal drugs. Some items may interact with your medicine. What should I watch for while using this medication? Visit your care team for regular checks on your progress. Tell your care team if your symptoms do not start to get better or if they get worse. Do not suddenly stop taking this medication. You may develop a severe reaction. Your care team will tell you how much medication to take. If your care team wants you to stop the medication, the dose may be slowly lowered over time to avoid any side effects. Patients and their families should watch out for new or worsening depression or thoughts of suicide. Also watch out for sudden changes in feelings such as feeling anxious, agitated, panicky, irritable, hostile, aggressive, impulsive, severely restless, overly excited and hyperactive, or not being able to sleep. If this happens, especially at the beginning of antidepressant treatment or after a change in dose, call your care team. This medication may affect your coordination, reaction time, or judgment. Do not drive or operate machinery until you know how this medication affects you. Sit up or stand slowly to reduce the risk of dizzy or fainting spells. Drinking alcohol with this medication can increase the risk of these side effects. This medication can cause problems with controlling your body temperature. It can lower the response of your body to cold temperatures. If possible, stay indoors during cold weather. If you must go outdoors, wear warm clothes. It can also lower the response of your body to heat. Do not overheat. Do not over-exercise. Stay out of  the sun when possible. If you must be in the sun, wear cool clothing. Drink plenty of water. If you have trouble controlling your body temperature, call your care team right away. This medication may cause dry eyes and blurred vision. If you wear contact lenses, you may feel some discomfort. Lubricating eye drops may help. See your care team if the problem does not go away or is severe. This medication may increase blood sugar. Ask your care team if changes in diet or medications are needed if you have diabetes. There have been reports of increased sexual urges or other strong urges such as gambling while taking this medication. If you experience any of these while taking this medication, you should report this to your care team as soon as possible. What side effects may I notice from receiving this medication? Side effects that you should report to your care team as soon as possible: Allergic reactions--skin rash, itching, hives, swelling of the face, lips, tongue, or throat High blood sugar (hyperglycemia)--increased thirst or amount of urine, unusual weakness or fatigue, blurry vision High fever, stiff muscles, increased sweating, fast or irregular heartbeat, and confusion, which may be signs of neuroleptic malignant syndrome Low blood pressure--dizziness, feeling faint or lightheaded, blurry vision Pain or trouble swallowing Prolonged or painful erection Seizures Stroke--sudden numbness or weakness of the face, arm, or leg, trouble speaking, confusion, trouble walking, loss of balance or coordination, dizziness, severe headache, change in vision Uncontrolled and repetitive body movements, muscle stiffness or spasms, tremors or shaking, loss of balance or coordination, restlessness, shuffling walk, which may be signs  of extrapyramidal symptoms (EPS) Thoughts of suicide or self-harm, worsening mood, feelings of depression Urges to engage in impulsive behaviors such as gambling, binge eating, sexual  activity, or shopping in ways that are unusual for you Side effects that usually do not require medical attention (report these to your care team if they continue or are bothersome): Constipation Drowsiness Weight gain This list may not describe all possible side effects. Call your doctor for medical advice about side effects. You may report side effects to FDA at 1-800-FDA-1088. Where should I keep my medication? Keep out of the reach of children and pets. Store at room temperature between 15 and 30 degrees C (59 and 86 degrees F). Throw away any unused medication after the expiration date. NOTE: This sheet is a summary. It may not cover all possible information. If you have questions about this medicine, talk to your doctor, pharmacist, or health care provider.  2024 Elsevier/Gold Standard (2021-10-09 00:00:00)

## 2023-07-17 ENCOUNTER — Ambulatory Visit
Admission: EM | Admit: 2023-07-17 | Discharge: 2023-07-17 | Disposition: A | Discharge status: Home / Self Care | Attending: Nurse Practitioner | Admitting: Nurse Practitioner

## 2023-07-17 DIAGNOSIS — S20362A Insect bite (nonvenomous) of left front wall of thorax, initial encounter: Secondary | ICD-10-CM | POA: Diagnosis not present

## 2023-07-17 DIAGNOSIS — W57XXXA Bitten or stung by nonvenomous insect and other nonvenomous arthropods, initial encounter: Secondary | ICD-10-CM

## 2023-07-17 DIAGNOSIS — J069 Acute upper respiratory infection, unspecified: Secondary | ICD-10-CM | POA: Diagnosis not present

## 2023-07-17 LAB — POC COVID19/FLU A&B COMBO
Covid Antigen, POC: NEGATIVE
Influenza A Antigen, POC: NEGATIVE
Influenza B Antigen, POC: NEGATIVE

## 2023-07-17 MED ORDER — BENZONATATE 100 MG PO CAPS: 100.0000 mg | ORAL_CAPSULE | Freq: Three times a day (TID) | ORAL | 0 refills | Status: AC | PRN

## 2023-07-17 NOTE — Discharge Instructions (Signed)
 You have a viral upper respiratory infection.  Symptoms should improve over the next week to 10 days.  If you develop chest pain or shortness of breath, go to the emergency room.  COVID-19 and influenza test is negative.      Some things that can make you feel better are: - Increased rest - Increasing fluid with water/sugar free electrolytes - Acetaminophen and ibuprofen as needed for fever/pain - Salt water gargling, chloraseptic spray and throat lozenges - OTC guaifenesin (Mucinex) 600 mg twice daily for congestion - Saline sinus flushes or a neti pot - Humidifying the air -Tessalon Perles every 8 hours as needed for dry cough

## 2023-07-17 NOTE — ED Provider Notes (Signed)
 [facility]    CSN: [id number] Arrival date & time: 07/17/23  1045      History   Chief Complaint Chief Complaint  Patient presents with   Cough   Tick Removal    HPI [name] is a 38 y.o. male.   Patient presents today with 3-day history of tactile fever, body aches and chills, dry cough, runny and stuffy nose, sore throat, headache, left ear pain down the left side of his neck, and fatigue.  Reports additionally, on Friday he had vertigo symptoms that have now mostly resolved.  He denies shortness of breath or chest pain at rest, abdominal pain, nausea/vomiting, and diarrhea.  No change in appetite.  Reports he has been around a coworker who had the flu last week.  Has taken NyQuil and Mucinex for symptoms with minimal temporary improvement.  Patient is also concerned about a tick bite to his left chest.  Reports he remove the tick 1 week ago and is now itchy.  He denies new muscle pain or joint aches, neck stiffness.  No rash around the tick bite.    Past Medical History:  Diagnosis Date   Anxiety    Atypical chest pain 04/2017   w/SOB. Pain radiates down left arm at times. Fam Hx of heart disease   Celiac disease    CTS (carpal tunnel syndrome) 2009   (R)   Diabetes mellitus 05/1997   type I    DKA (diabetic ketoacidoses) 2003 & 2004   GERD (gastroesophageal reflux disease)    Hyperlipidemia    Hypothyroidism    Pilonidal cyst 2008   Seizure (HCC) 2002   hypoglycemic   Seizures (HCC) 1990, 1991   febrile seizures x 3    Patient Active Problem List   Diagnosis Date Noted   At risk for prolonged QT interval syndrome 07/13/2023   Depression 07/13/2023   GAD (generalized anxiety disorder) 06/07/2023   Attention and concentration deficit 06/07/2023   Acute frontal sinusitis, unspecified 05/15/2021   Controlled type 1 diabetes mellitus with ophthalmic complication, with long-term current use of insulin (HCC) 12/11/2020   Hyperopia with  astigmatism, bilateral 10/04/2019   Celiac disease 07/31/2019   Chest pain of uncertain etiology 07/30/2019   Diabetes mellitus type 1, controlled, insulin dependent (HCC) 07/30/2019   Hypothyroidism 07/30/2019   Herpes zoster 07/28/2013    Past Surgical History:  Procedure Laterality Date   none     TYMPANOSTOMY TUBE PLACEMENT  1990       Home Medications    Prior to Admission medications   Medication Sig Start Date End Date Taking? Authorizing Provider  benzonatate (TESSALON) 100 MG capsule Take 1 capsule (100 mg total) by mouth 3 (three) times daily as needed for cough. Do not take with alcohol or while operating or driving heavy machinery 4/69/62  Yes Thena Fireman A, NP  Continuous Glucose Sensor (DEXCOM G6 SENSOR) MISC change every 10 days for 90 days   Yes [provider]  DULoxetine (CYMBALTA) 60 MG capsule Take 1 capsule (60 mg total) by mouth daily. 03/21/23  Yes   insulin aspart (NOVOLOG) 100 UNIT/ML injection Inject 200 Units into the skin See admin instructions. 200 units into pump   Yes [provider]  levothyroxine (SYNTHROID) 100 MCG tablet Take 1 tablet (100 mcg total) by mouth daily on an empty stomach 30 minutes before a meal 04/19/22  Yes   omeprazole (PRILOSEC) 40 MG capsule TAKE 1 CAPSULE BY MOUTH ONCE DAILY  04/19/22  Yes   rosuvastatin (CRESTOR) 5 MG tablet Take 1 tablet (5 mg total) by mouth daily. 05/27/22  Yes   Blood Glucose Monitoring Suppl (ONETOUCH VERIO FLEX SYSTEM) w/Device KIT See admin instructions.    [provider]  busPIRone (BUSPAR) 10 MG tablet Take 1 tablet (10 mg total) by mouth 2 (two) times daily as needed. 04/04/23     Continuous Glucose Transmitter (DEXCOM G6 TRANSMITTER) MISC SMARTSIG:Every 3 Months 05/31/23   [provider]  DULoxetine (CYMBALTA) 20 MG capsule Take 1 capsule (20 mg total) by mouth daily. Take along with 60 mg daily 07/13/23   Jomarie Longs, MD  Insulin Disposable Pump (OMNIPOD 5  DEXG7G6 PODS GEN 5) MISC Apply Pod and use to deliver insulin for 48 hours, then remove and replace 09/20/22     Insulin Disposable Pump (OMNIPOD 5 DEXG7G6 PODS GEN 5) MISC Change every 48 hours, to deliver insulin continuously 04/04/23     Insulin Disposable Pump (OMNIPOD 5 G6 PODS, GEN 5,) MISC Use to deliver insulin continuously, changing every 48 hours. 11/17/22     Insulin Disposable Pump (OMNIPOD DASH SYSTEM) KIT by Does not apply route.    [provider]  insulin lispro (HUMALOG) 100 UNIT/ML injection Use in omnipod daily  (CHO 10, ISF 35, TARGET 120). Max daily dose 100 units or increase as directed by MD. 01/30/23     moxifloxacin (VIGAMOX) 0.5 % ophthalmic solution Place 1 drop into the left eye every 8 (eight) hours. 06/28/23   [provider]  Lum Babe test strip 1 strip In Vitro four times daily for 30 days 05/31/23   [provider]  propranolol (INDERAL) 10 MG tablet Take 1 tablet (10 mg total) by mouth 2 (two) times daily as needed. For severe anxiety only 06/07/23   Jomarie Longs, MD    Family History Family History  Problem Relation Age of Onset   Celiac disease Mother    Irritable bowel syndrome Father    Hypertension Maternal Grandfather    High Cholesterol Maternal Grandfather    Hypertension Paternal Grandfather    High Cholesterol Paternal Grandfather    Heart disease Other    Colon cancer Neg Hx    Colon polyps Neg Hx    Esophageal cancer Neg Hx    Rectal cancer Neg Hx    Stomach cancer Neg Hx    Mental illness Neg Hx     Social History Social History   Tobacco Use   Smoking status: Every Day    Current packs/day: 1.00    Average packs/day: 1 pack/day for 10.0 years (10.0 ttl pk-yrs)    Types: Cigarettes   Smokeless tobacco: Never  Vaping Use   Vaping status: Never Used  Substance Use Topics   Alcohol use: Yes    Comment: occasional   Drug use: No     Allergies   Codeine and Prednisone   Review of Systems Review  of Systems Per HPI  Physical Exam Triage Vital Signs ED Triage Vitals  Encounter Vitals Group     BP 07/17/23 1152 129/83     Systolic BP Percentile --      Diastolic BP Percentile --      Pulse Rate 07/17/23 1152 83     Resp 07/17/23 1152 16     Temp 07/17/23 1152 98.1 F (36.7 C)     Temp Source 07/17/23 1152 Oral     SpO2 07/17/23 1152 96 %     Weight --  Height --      Head Circumference --      Peak Flow --      Pain Score 07/17/23 1153 0     Pain Loc --      Pain Education --      Exclude from Growth Chart --    No data found.  Updated Vital Signs BP 129/83 (BP Location: Right Arm)   Pulse 83   Temp 98.1 F (36.7 C) (Oral)   Resp 16   SpO2 96%   Visual Acuity Right Eye Distance:   Left Eye Distance:   Bilateral Distance:    Right Eye Near:   Left Eye Near:    Bilateral Near:     Physical Exam Vitals and nursing note reviewed.  Constitutional:      General: He is not in acute distress.    Appearance: Normal appearance. He is not ill-appearing or toxic-appearing.  HENT:     Head: Normocephalic and atraumatic.     Right Ear: Tympanic membrane, ear canal and external ear normal.     Left Ear: Tympanic membrane, ear canal and external ear normal.     Nose: No congestion or rhinorrhea.     Mouth/Throat:     Mouth: Mucous membranes are moist.     Pharynx: Oropharynx is clear. No oropharyngeal exudate or posterior oropharyngeal erythema.  Eyes:     General: No scleral icterus.    Extraocular Movements: Extraocular movements intact.  Cardiovascular:     Rate and Rhythm: Normal rate and regular rhythm.  Pulmonary:     Effort: Pulmonary effort is normal. No respiratory distress.     Breath sounds: Normal breath sounds. No wheezing, rhonchi or rales.  Musculoskeletal:     Cervical back: Normal range of motion and neck supple.  Lymphadenopathy:     Cervical: No cervical adenopathy.  Skin:    General: Skin is warm and dry.     Coloration: Skin is not  jaundiced or pale.     Findings: No erythema or rash.          Comments: Flesh-colored papule noted to left chest wall in approximately remark.  No surrounding erythema, bull's-eye rash, warmth, active drainage.  Neurological:     Mental Status: He is alert and oriented to person, place, and time.  Psychiatric:        Behavior: Behavior is cooperative.      UC Treatments / Results  Labs (all labs ordered are listed, but only abnormal results are displayed) Labs Reviewed  POC COVID19/FLU A&B COMBO    EKG   Radiology No results found.  Procedures Procedures (including critical care time)  Medications Ordered in UC Medications - No data to display  Initial Impression / Assessment and Plan / UC Course  I have reviewed the triage vital signs and the nursing notes.  Pertinent labs & imaging results that were available during my care of the patient were reviewed by me and considered in my medical decision making (see chart for details).   Patient is well-appearing, normotensive, afebrile, not tachycardic, not tachypneic, oxygenating well on room air.    1. Viral URI with cough Vitals and exam are reassuring today, suspect viral etiology COVID-19, influenza testing is negative Supportive care discussed with patient, start cough suppressant medication ER and return precautions discussed  2. Tick bite of left front wall of thorax, initial encounter Reassurance provided Can use topical over-the-counter hydrocortisone cream as needed for itching Return and ER precautions  discussed with patient  The patient was given the opportunity to ask questions.  All questions answered to their satisfaction.  The patient is in agreement to this plan.   Final Clinical Impressions(s) / UC Diagnoses   Final diagnoses:  Viral URI with cough  Tick bite of left front wall of thorax, initial encounter     Discharge Instructions      You have a viral upper respiratory infection.   Symptoms should improve over the next week to 10 days.  If you develop chest pain or shortness of breath, go to the emergency room.  COVID-19 and influenza test is negative.   Some things that can make you feel better are: - Increased rest - Increasing fluid with water/sugar free electrolytes - Acetaminophen and ibuprofen as needed for fever/pain - Salt water gargling, chloraseptic spray and throat lozenges - OTC guaifenesin (Mucinex) 600 mg twice daily for congestion - Saline sinus flushes or a neti pot - Humidifying the air -Tessalon Perles every 8 hours as needed for dry cough      ED Prescriptions     Medication Sig Dispense Auth. Provider   benzonatate (TESSALON) 100 MG capsule Take 1 capsule (100 mg total) by mouth 3 (three) times daily as needed for cough. Do not take with alcohol or while operating or driving heavy machinery 30 capsule Wilhemena Harbour, NP      PDMP not reviewed this encounter.   Wilhemena Harbour, NP 07/17/23 [phone]

## 2023-07-17 NOTE — ED Triage Notes (Signed)
 Tick bite on left shoulder that may still have the head of the tick in there pt noticed it 8-9 days ago. Then had some dizziness, body aches, chills, congestion, sinus pressure, cough x 2 days. Taking Nyquil, mucinex.

## 2023-08-07 ENCOUNTER — Other Ambulatory Visit: Payer: Self-pay | Admitting: Psychiatry

## 2023-08-07 DIAGNOSIS — R41840 Attention and concentration deficit: Secondary | ICD-10-CM

## 2023-08-07 DIAGNOSIS — F411 Generalized anxiety disorder: Secondary | ICD-10-CM

## 2023-08-10 ENCOUNTER — Encounter: Payer: Self-pay | Admitting: Psychiatry

## 2023-08-10 ENCOUNTER — Ambulatory Visit (INDEPENDENT_AMBULATORY_CARE_PROVIDER_SITE_OTHER): Admitting: Psychiatry

## 2023-08-10 VITALS — BP 116/74 | HR 82 | Temp 98.6°F | Ht 67.25 in | Wt 175.0 lb

## 2023-08-10 DIAGNOSIS — F330 Major depressive disorder, recurrent, mild: Secondary | ICD-10-CM | POA: Diagnosis not present

## 2023-08-10 DIAGNOSIS — Z91148 Patient's other noncompliance with medication regimen for other reason: Secondary | ICD-10-CM | POA: Diagnosis not present

## 2023-08-10 DIAGNOSIS — F411 Generalized anxiety disorder: Secondary | ICD-10-CM | POA: Diagnosis not present

## 2023-08-10 MED ORDER — BUSPIRONE HCL 10 MG PO TABS
10.0000 mg | ORAL_TABLET | Freq: Two times a day (BID) | ORAL | 2 refills | Status: DC
Start: 2023-08-10 — End: 2023-10-12

## 2023-08-10 NOTE — Progress Notes (Signed)
 [facility] MD OP Progress Note  08/10/2023 12:42 PM [name]  MRN:  [id number]  Chief Complaint:  Chief Complaint  Patient presents with   Follow-up   Anxiety   Depression   Medication Refill   Discussed the use of AI scribe software for clinical note transcription with the patient, who gave verbal consent to proceed.  History of Present Illness [name] is a 38 year old Caucasian male, currently separated from wife, going through her divorce, currently in a relationship with his girlfriend, employed, lives in [place], has a history of GAD, depression, attention and focus deficit, diabetes mellitus type 1, gastroesophageal reflux disease, hypothyroidism, history of seizures as a child, hyperlipidemia was evaluated in office today for a follow-up appointment.  He experiences ongoing anxiety, often triggered by inconvenient events, leading to increased stress levels. Despite this, he describes himself as a 'happy go lucky person' and denies any suicidal thoughts. His depression is less problematic compared to his anxiety.  He currently reports multiple situational stressors which is keeping him anxious.  Recent work-related stressors also leads to anxiety.  He notes possible chest pain since the past several days rates it at a 3 out of 10 today.  He reports the chest pain gets worse when he is under a lot of stress.  He currently reports it is tolerable.  He does not believe he needs to go to the urgent care or emergency department at this time.  He agrees to get in touch with his primary care provider if chest pain does not get better.  He also had a recent URI for which he went to the urgent care and was treated for the same 2 to 3 weeks ago.  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 He did not take Cymbalta  on a night he was unexpectedly called into work. He is not  currently taking Buspar  as it was not filled by the pharmacy.  He is not ready to be going back on the BuSpar  for anxiety symptoms.  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 He lives with his girlfriend. He is planning a beach trip in June and a cruise in September for relaxation.  He has upcoming appointment with therapist in July and plans to keep it.  He is motivated to start CBT.  He currently denies any suicidality, homicidality or perceptual disturbances.   Visit Diagnosis:    ICD-10-CM   1. GAD (generalized anxiety disorder)  F41.1 busPIRone  (BUSPAR ) 10 MG tablet    2. Mild episode of recurrent major depressive disorder (HCC)  F33.0 busPIRone  (BUSPAR ) 10 MG tablet    3. Noncompliance with medication regimen  Z91.148       Past Psychiatric History: I have reviewed past psychiatric history from progress note on 06/07/2023.  Past trials of medications like Wellbutrin -side effect, Lexapro -side effect.  Past Medical History:  Past Medical History:  Diagnosis Date   Anxiety    Atypical chest pain 04/2017   w/SOB. Pain radiates down left arm at times. Fam Hx of heart disease   Celiac disease    CTS (carpal tunnel syndrome) 2009   (R)   Diabetes mellitus 05/1997   type I    DKA (diabetic ketoacidoses) 2003 & 2004   GERD (gastroesophageal reflux disease)    Hyperlipidemia    Hypothyroidism  Pilonidal cyst 2008   Seizure [facility]) 2002   hypoglycemic   Seizures (HCC) 1990, 1991   febrile seizures x 3    Past Surgical History:  Procedure Laterality Date   none     TYMPANOSTOMY TUBE PLACEMENT  1990    Family Psychiatric History: I have reviewed family psychiatric history from progress note on 06/07/2023.  Family History:  Family History  Problem Relation Age of Onset   Celiac disease Mother    Irritable bowel syndrome Father    Hypertension Maternal Grandfather    High Cholesterol Maternal Grandfather     Hypertension Paternal Grandfather    High Cholesterol Paternal Grandfather    Heart disease Other    Colon cancer Neg Hx    Colon polyps Neg Hx    Esophageal cancer Neg Hx    Rectal cancer Neg Hx    Stomach cancer Neg Hx    Mental illness Neg Hx     Social History: I have reviewed social history from progress note on 06/07/2023. Social History   Socioeconomic History   Marital status: Legally Separated    Spouse name: Not on file   Number of children: 4   Years of education: Not on file   Highest education level: GED or equivalent  Occupational History   Occupation: Holiday representative  Tobacco Use   Smoking status: Every Day    Current packs/day: 1.00    Average packs/day: 1 pack/day for 10.0 years (10.0 ttl pk-yrs)    Types: Cigarettes   Smokeless tobacco: Never  Vaping Use   Vaping status: Never Used  Substance and Sexual Activity   Alcohol  use: Not Currently    Comment: occasional   Drug use: Not Currently   Sexual activity: Yes    Partners: Female    Birth control/protection: None  Other Topics Concern   Not on file  Social History Narrative   Not on file   Social Drivers of Health   Financial Resource Strain: Not on file  Food Insecurity: Not on file  Transportation Needs: Not on file  Physical Activity: Not on file  Stress: Not on file  Social Connections: Not on file    Allergies:  Allergies  Allergen Reactions   Codeine Other (See Comments)    Altered Mental status   Prednisone  Other (See Comments)    Extreme hyperglycemia    Metabolic Disorder Labs: Lab Results  Component Value Date   HGBA1C 9.3 03/07/2016   No results found for: "PROLACTIN" No results found for: "CHOL", "TRIG", "HDL", "CHOLHDL", "VLDL", "LDLCALC" No results found for: "TSH"  Therapeutic Level Labs: No results found for: "LITHIUM" No results found for: "VALPROATE" No results found for: "CBMZ"  Current Medications: Current Outpatient Medications  Medication Sig Dispense  Refill   Blood Glucose Monitoring Suppl (ONETOUCH VERIO FLEX SYSTEM) w/Device KIT See admin instructions.     Continuous Glucose Sensor (DEXCOM G6 SENSOR) MISC change every 10 days for 90 days     Continuous Glucose Transmitter (DEXCOM G6 TRANSMITTER) MISC SMARTSIG:Every 3 Months     DULoxetine  (CYMBALTA ) 60 MG capsule Take 1 capsule (60 mg total) by mouth daily. 90 capsule 3   insulin  aspart (NOVOLOG) 100 UNIT/ML injection Inject 200 Units into the skin See admin instructions. 200 units into pump     Insulin  Disposable Pump (OMNIPOD 5 DEXG7G6 PODS GEN 5) MISC Apply Pod and use to deliver insulin  for 48 hours, then remove and replace 45 each 3   Insulin  Disposable Pump (OMNIPOD  5 DEXG7G6 PODS GEN 5) MISC Change every 48 hours, to deliver insulin  continuously 45 each 3   Insulin  Disposable Pump (OMNIPOD 5 G6 PODS, GEN 5,) MISC Use to deliver insulin  continuously, changing every 48 hours. 45 each 3   levothyroxine  (SYNTHROID ) 100 MCG tablet Take 1 tablet (100 mcg total) by mouth daily on an empty stomach 30 minutes before a meal 90 tablet 3   omeprazole  (PRILOSEC) 40 MG capsule TAKE 1 CAPSULE BY MOUTH ONCE DAILY 90 capsule 4   ONETOUCH VERIO test strip 1 strip In Vitro four times daily for 30 days     rosuvastatin  (CRESTOR ) 5 MG tablet Take 1 tablet (5 mg total) by mouth daily. 90 tablet 3   benzonatate  (TESSALON ) 100 MG capsule Take 1 capsule (100 mg total) by mouth 3 (three) times daily as needed for cough. Do not take with alcohol  or while operating or driving heavy machinery (Patient not taking: Reported on 08/10/2023) 30 capsule 0   busPIRone  (BUSPAR ) 10 MG tablet Take 1 tablet (10 mg total) by mouth 2 (two) times daily. Take 1 tablet at 8 AM and at 2 PM daily 60 tablet 2   DULoxetine  (CYMBALTA ) 20 MG capsule Take 1 capsule (20 mg total) by mouth daily. Take along with 60 mg daily (Patient not taking: Reported on 08/10/2023) 30 capsule 1   Insulin  Disposable Pump (OMNIPOD DASH SYSTEM) KIT by Does not  apply route.     insulin  lispro (HUMALOG ) 100 UNIT/ML injection Use in omnipod daily  (CHO 10, ISF 35, TARGET 120). Max daily dose 100 units or increase as directed by MD. (Patient not taking: Reported on 08/10/2023) 40 mL 3   moxifloxacin (VIGAMOX) 0.5 % ophthalmic solution Place 1 drop into the left eye every 8 (eight) hours. (Patient not taking: Reported on 08/10/2023)     propranolol  (INDERAL ) 10 MG tablet TAKE 1 TABLET (10 MG TOTAL) BY MOUTH 2 (TWO) TIMES DAILY AS NEEDED. FOR SEVERE ANXIETY ONLY (Patient not taking: Reported on 08/10/2023) 60 tablet 1   No current facility-administered medications for this visit.     Musculoskeletal: Strength & Muscle Tone: within normal limits Gait & Station: normal Patient leans: N/A  Psychiatric Specialty Exam: Review of Systems  Psychiatric/Behavioral:  Positive for decreased concentration, dysphoric mood and sleep disturbance. The patient is nervous/anxious.     Blood pressure 116/74, pulse 82, temperature 98.6 F (37 C), temperature source Oral, height 5' 7.25" (1.708 m), weight 175 lb (79.4 kg), SpO2 99%.Body mass index is 27.21 kg/m.  General Appearance: Casual  Eye Contact:  Fair  Speech:  Normal Rate  Volume:  Normal  Mood:  Anxious and Depressed  Affect:  Appropriate  Thought Process:  Goal Directed and Descriptions of Associations: Intact  Orientation:  Full (Time, Place, and Person)  Thought Content: Logical   Suicidal Thoughts:  No  Homicidal Thoughts:  No  Memory:  Immediate;   Fair Recent;   Fair Remote;   Fair  Judgement:  Fair  Insight:  Fair  Psychomotor Activity:  Normal  Concentration:  Concentration: Fair and Attention Span: Fair  Recall:  [employer] of Knowledge: Fair  Language: Fair  Akathisia:  No  Handed:  Right  AIMS (if indicated): not done  Assets:  Communication Skills Desire for Improvement Housing Intimacy Social Support  ADL's:  Intact  Cognition: WNL  Sleep:  Varies due to work schedule     Screenings: GAD-7    Loss adjuster, chartered Office Visit from 08/10/2023  in [facility] Office Visit from 07/13/2023 in [facility] Office Visit from 06/07/2023 in [facility]  Total GAD-7 Score 18 17 14       PHQ2-9    Flowsheet Row Office Visit from 08/10/2023 in [facility] Office Visit from 07/13/2023 in [facility] Office Visit from 06/07/2023 in [facility]  PHQ-2 Total Score 1 2 0  PHQ-9 Total Score 7 8 --      Flowsheet Row Office Visit from 08/10/2023 in [facility] UC from 07/17/2023 in [facility] Visit from 07/13/2023 in [facility]  C-SSRS RISK CATEGORY No Risk No Risk No Risk        Assessment and Plan: [name] is a 38 year old Caucasian male, has a history of anxiety, depression was evaluated in office today, discussed assessment and plan as noted below.  Generalized anxiety disorder-unstable Continues to have situational stressors and struggles with anxiety mostly related to work, taking care of his children, being a single parent.  Has been noncompliant with medication regimen as discussed mostly due to excessive sleepiness when duloxetine  higher dosage was taken at bedtime.  Although he reports it does not make him drowsy when he take it.  Agreeable to dividing the dosage of Duloxetine  and reinitiating BuSpar  since he reports he has not been compliant with the BuSpar  and is in need of a refill. - Continue Duloxetine  80 mg.  Encouraged to take 20 mg during the day and 60 mg at bedtime. - Restart BuSpar  10 mg twice daily to address current mood symptoms. - Continue Propranolol  10 mg twice a day as needed for severe anxiety -  Patient needs to keep her scheduled psychotherapy visit with therapist in July/[place].  MDD-mild-improving Reports depression symptoms are not very significant at this time.  Anxiety is more than depression.  He does struggle with sleep mostly because of not having a good sleep hygiene. - Continue Duloxetine  80 mg daily - Consider addition of Abilify if needed in the future. - Encouraged to start CBT.  Noncompliance with medication regimen-unstable Patient is currently noncompliant with medications as prescribed. - Provided education, encouraged compliance.  Rule out attention deficit disorder-patient has been referred for testing.  Patient with chest pain advised to follow up with primary care provider or go to the nearest urgent care.  Follow-up Follow-up in clinic in 4 to 5 weeks or sooner if needed.   Collaboration of Care: Collaboration of Care: Referral or follow-up with counselor/therapist AEB encouraged to keep appointment with therapist as scheduled.  Patient advised to go to the nearest urgent care or contact primary care provider for his current chest pain.  Patient/Guardian was advised Release of Information must be obtained prior to any record release in order to collaborate their care with an outside provider. Patient/Guardian was advised if they have not already done so to contact the registration department to sign all necessary forms in order for us  to release information regarding their care.   Consent: Patient/Guardian gives verbal consent for treatment and assignment of benefits for services provided during this visit. Patient/Guardian expressed understanding and agreed to proceed.   This note was generated in part or whole with voice recognition software. Voice recognition is usually quite accurate but there are transcription errors that can and very often  do occur. I apologize for any typographical errors that were not detected and  corrected.      Midas Daughety, MD 08/10/2023, 12:42 PM

## 2023-08-14 ENCOUNTER — Encounter: Payer: Self-pay | Admitting: Psychology

## 2023-08-17 DIAGNOSIS — Z794 Long term (current) use of insulin: Secondary | ICD-10-CM | POA: Diagnosis not present

## 2023-08-17 DIAGNOSIS — E1042 Type 1 diabetes mellitus with diabetic polyneuropathy: Secondary | ICD-10-CM | POA: Diagnosis not present

## 2023-08-17 DIAGNOSIS — E785 Hyperlipidemia, unspecified: Secondary | ICD-10-CM | POA: Diagnosis not present

## 2023-08-17 DIAGNOSIS — Z4681 Encounter for fitting and adjustment of insulin pump: Secondary | ICD-10-CM | POA: Diagnosis not present

## 2023-09-06 ENCOUNTER — Encounter: Attending: Psychology | Admitting: Psychology

## 2023-09-06 ENCOUNTER — Other Ambulatory Visit: Payer: Self-pay | Admitting: Psychiatry

## 2023-09-06 DIAGNOSIS — F411 Generalized anxiety disorder: Secondary | ICD-10-CM

## 2023-09-06 DIAGNOSIS — F32A Depression, unspecified: Secondary | ICD-10-CM

## 2023-09-07 ENCOUNTER — Other Ambulatory Visit: Payer: Self-pay | Admitting: Psychiatry

## 2023-09-07 DIAGNOSIS — R41840 Attention and concentration deficit: Secondary | ICD-10-CM

## 2023-09-12 ENCOUNTER — Encounter: Payer: Self-pay | Admitting: Psychiatry

## 2023-09-12 ENCOUNTER — Telehealth (INDEPENDENT_AMBULATORY_CARE_PROVIDER_SITE_OTHER): Admitting: Psychiatry

## 2023-09-12 DIAGNOSIS — F411 Generalized anxiety disorder: Secondary | ICD-10-CM

## 2023-09-12 DIAGNOSIS — R41840 Attention and concentration deficit: Secondary | ICD-10-CM | POA: Diagnosis not present

## 2023-09-12 DIAGNOSIS — F3341 Major depressive disorder, recurrent, in partial remission: Secondary | ICD-10-CM | POA: Diagnosis not present

## 2023-09-12 DIAGNOSIS — Z91148 Patient's other noncompliance with medication regimen for other reason: Secondary | ICD-10-CM | POA: Diagnosis not present

## 2023-09-12 NOTE — Progress Notes (Signed)
 Virtual Visit via Video Note  I connected with [name] on 09/12/23 at 11:20 AM EDT by a video enabled telemedicine application and verified that I am speaking with the correct person using two identifiers.  Location Provider Location : [facility] Patient Location : Home  Participants: Patient , Provider   I discussed the limitations of evaluation and management by telemedicine and the availability of in person appointments. The patient expressed understanding and agreed to proceed.   I discussed the assessment and treatment plan with the patient. The patient was provided an opportunity to ask questions and all were answered. The patient agreed with the plan and demonstrated an understanding of the instructions.   The patient was advised to call back or seek an in-person evaluation if the symptoms worsen or if the condition fails to improve as anticipated.  [facility] MD OP Progress Note  09/12/2023 2:38 PM [name]  MRN:  [id number]  Chief Complaint:  Chief Complaint  Patient presents with   Follow-up   Anxiety   Depression   Medication Refill   Discussed the use of AI scribe software for clinical note transcription with the patient, who gave verbal consent to proceed.  History of Present Illness [name] is a 38 year old Caucasian male, currently separated from wife, going through divorce, currently in a relationship with girlfriend, employed, lives in [place], has a history of GAD, depression, attention and focus deficit, diabetes mellitus type 1, gastroesophageal reflux disease, hypothyroidism, history of seizures as a child, hyperlipidemia was evaluated by telemedicine today.  Currently reports anxiety and depression symptoms as overall improved on the current medication regimen.  Previously anxiety was more problematic than depression.  However currently reports he has been more compliant on medications and that has been beneficial with management of his mood  symptoms.  He mentions difficulty maintaining focus due to multiple simultaneous events, although he can focus initially.  Was referred for ADHD testing.  However per review of medical records, patient no-showed for his appointment scheduled with Dr.Baer.  Patient however reports he was not aware of an appointment that he no-showed and agrees to reach out to them.  He is currently taking Cymbalta , 60 mg at night and 20 mg in the morning, and buspirone , 10 mg twice a day, with no reported side effects.   He has an upcoming appointment for psychotherapy scheduled for the first of next month.  He is motivated to start therapy.  Denies any suicidality, homicidality or perceptual disturbances.    Visit Diagnosis:    ICD-10-CM   1. GAD (generalized anxiety disorder)  F41.1     2. Recurrent major depressive disorder, in partial remission (HCC)  F33.41     3. Noncompliance with medication regimen  Z91.148     4. Attention and concentration deficit  R41.840       Past Psychiatric History: I have reviewed past psychiatric history from progress note on 06/07/2023.  Past trials of medications like Wellbutrin -side effect, Lexapro -side effect.  Past Medical History:  Past Medical History:  Diagnosis Date   Anxiety    Atypical chest pain 04/2017   w/SOB. Pain radiates down left arm at times. Fam Hx of heart disease   Celiac disease    CTS (carpal tunnel syndrome) 2009   (R)   Diabetes mellitus 05/1997   type I    DKA (diabetic ketoacidoses) 2003 & 2004   GERD (gastroesophageal reflux disease)    Hyperlipidemia    Hypothyroidism  Pilonidal cyst 2008   Seizure [facility]) 2002   hypoglycemic   Seizures (HCC) 1990, 1991   febrile seizures x 3    Past Surgical History:  Procedure Laterality Date   none     TYMPANOSTOMY TUBE PLACEMENT  1990    Family Psychiatric History: I have reviewed family psychiatric history from progress note on 06/07/2023.  Family History:  Family History  Problem  Relation Age of Onset   Celiac disease Mother    Irritable bowel syndrome Father    Hypertension Maternal Grandfather    High Cholesterol Maternal Grandfather    Hypertension Paternal Grandfather    High Cholesterol Paternal Grandfather    Heart disease Other    Colon cancer Neg Hx    Colon polyps Neg Hx    Esophageal cancer Neg Hx    Rectal cancer Neg Hx    Stomach cancer Neg Hx    Mental illness Neg Hx     Social History: I have reviewed social history from progress note on 06/07/2023. Social History   Socioeconomic History   Marital status: Legally Separated    Spouse name: Not on file   Number of children: 4   Years of education: Not on file   Highest education level: GED or equivalent  Occupational History   Occupation: Holiday representative  Tobacco Use   Smoking status: Every Day    Current packs/day: 1.00    Average packs/day: 1 pack/day for 10.0 years (10.0 ttl pk-yrs)    Types: Cigarettes   Smokeless tobacco: Never  Vaping Use   Vaping status: Never Used  Substance and Sexual Activity   Alcohol  use: Not Currently    Comment: occasional   Drug use: Not Currently   Sexual activity: Yes    Partners: Female    Birth control/protection: None  Other Topics Concern   Not on file  Social History Narrative   Not on file   Social Drivers of Health   Financial Resource Strain: Not on file  Food Insecurity: Not on file  Transportation Needs: Not on file  Physical Activity: Not on file  Stress: Not on file  Social Connections: Not on file    Allergies:  Allergies  Allergen Reactions   Codeine Other (See Comments)    Altered Mental status   Prednisone  Other (See Comments)    Extreme hyperglycemia    Metabolic Disorder Labs: Lab Results  Component Value Date   HGBA1C 9.3 03/07/2016   No results found for: "PROLACTIN" No results found for: "CHOL", "TRIG", "HDL", "CHOLHDL", "VLDL", "LDLCALC" No results found for: "TSH"  Therapeutic Level Labs: No results  found for: "LITHIUM" No results found for: "VALPROATE" No results found for: "CBMZ"  Current Medications: Current Outpatient Medications  Medication Sig Dispense Refill   benzonatate  (TESSALON ) 100 MG capsule Take 1 capsule (100 mg total) by mouth 3 (three) times daily as needed for cough. Do not take with alcohol  or while operating or driving heavy machinery (Patient not taking: Reported on 08/10/2023) 30 capsule 0   Blood Glucose Monitoring Suppl (ONETOUCH VERIO FLEX SYSTEM) w/Device KIT See admin instructions.     busPIRone  (BUSPAR ) 10 MG tablet Take 1 tablet (10 mg total) by mouth 2 (two) times daily. Take 1 tablet at 8 AM and at 2 PM daily 60 tablet 2   Continuous Glucose Sensor (DEXCOM G6 SENSOR) MISC change every 10 days for 90 days     Continuous Glucose Transmitter (DEXCOM G6 TRANSMITTER) MISC SMARTSIG:Every 3 Months  DULoxetine  (CYMBALTA ) 20 MG capsule TAKE 1 CAPSULE (20 MG TOTAL) BY MOUTH DAILY. TAKE ALONG WITH 60 MG DAILY 30 capsule 1   DULoxetine  (CYMBALTA ) 60 MG capsule Take 1 capsule (60 mg total) by mouth daily. 90 capsule 3   insulin  aspart (NOVOLOG) 100 UNIT/ML injection Inject 200 Units into the skin See admin instructions. 200 units into pump     Insulin  Disposable Pump (OMNIPOD 5 DEXG7G6 PODS GEN 5) MISC Apply Pod and use to deliver insulin  for 48 hours, then remove and replace 45 each 3   Insulin  Disposable Pump (OMNIPOD 5 DEXG7G6 PODS GEN 5) MISC Change every 48 hours, to deliver insulin  continuously 45 each 3   Insulin  Disposable Pump (OMNIPOD 5 G6 PODS, GEN 5,) MISC Use to deliver insulin  continuously, changing every 48 hours. 45 each 3   Insulin  Disposable Pump (OMNIPOD DASH SYSTEM) KIT by Does not apply route.     insulin  lispro (HUMALOG ) 100 UNIT/ML injection Use in omnipod daily  (CHO 10, ISF 35, TARGET 120). Max daily dose 100 units or increase as directed by MD. (Patient not taking: Reported on 08/10/2023) 40 mL 3   levothyroxine  (SYNTHROID ) 100 MCG tablet Take 1  tablet (100 mcg total) by mouth daily on an empty stomach 30 minutes before a meal 90 tablet 3   moxifloxacin (VIGAMOX) 0.5 % ophthalmic solution Place 1 drop into the left eye every 8 (eight) hours. (Patient not taking: Reported on 08/10/2023)     omeprazole  (PRILOSEC) 40 MG capsule TAKE 1 CAPSULE BY MOUTH ONCE DAILY 90 capsule 4   ONETOUCH VERIO test strip 1 strip In Vitro four times daily for 30 days     propranolol  (INDERAL ) 10 MG tablet TAKE 1 TABLET (10 MG TOTAL) BY MOUTH 2 (TWO) TIMES DAILY AS NEEDED. FOR SEVERE ANXIETY ONLY (Patient not taking: Reported on 08/10/2023) 60 tablet 1   rosuvastatin  (CRESTOR ) 5 MG tablet Take 1 tablet (5 mg total) by mouth daily. 90 tablet 3   No current facility-administered medications for this visit.     Musculoskeletal: Strength & Muscle Tone: UTA Gait & Station: Seated Patient leans: N/A  Psychiatric Specialty Exam: Review of Systems  Psychiatric/Behavioral:  The patient is nervous/anxious.     There were no vitals taken for this visit.There is no height or weight on file to calculate BMI.  General Appearance: Casual  Eye Contact:  Fair  Speech:  Normal Rate  Volume:  Normal  Mood:  Anxious  Affect:  Appropriate  Thought Process:  Goal Directed and Descriptions of Associations: Intact  Orientation:  Full (Time, Place, and Person)  Thought Content: Logical   Suicidal Thoughts:  No  Homicidal Thoughts:  No  Memory:  Immediate;   Fair Recent;   Fair Remote;   Fair  Judgement:  Fair  Insight:  Fair  Psychomotor Activity:  Normal  Concentration:  Concentration: Fair and Attention Span: Fair  Recall:  [employer] of Knowledge: Fair  Language: Fair  Akathisia:  No  Handed:  Right  AIMS (if indicated): not done  Assets:  Communication Skills Desire for Improvement Housing Social Support Transportation  ADL's:  Intact  Cognition: WNL  Sleep:  improving   Screenings: GAD-7    Flowsheet Row Office Visit from 08/10/2023 in [facility] Office Visit from 07/13/2023 in [facility] Office Visit from 06/07/2023 in [facility]  Total GAD-7 Score 18 17 14  PHQ2-9    Flowsheet Row Office Visit from 08/10/2023 in [facility] Office Visit from 07/13/2023 in [facility] Office Visit from 06/07/2023 in [facility]  PHQ-2 Total Score 1 2 0  PHQ-9 Total Score 7 8 --      Flowsheet Row Video Visit from 09/12/2023 in [facility] Office Visit from 08/10/2023 in [facility] from 07/17/2023 in [facility] [place]  C-SSRS RISK CATEGORY No Risk No Risk No Risk        Assessment and Plan: [name] is a 38 year old Caucasian male, has a history of anxiety depression, attention and focus deficit was evaluated by telemedicine today.  Discussed assessment and plan as noted below.  Generalized anxiety disorder-improving Currently reports anxiety as improved on the current medication regimen.  He has been compliant on the duloxetine  and BuSpar  and denies side effects.  Motivated to start psychotherapy sessions. Continue Duloxetine  80 mg daily Continue BuSpar  10 mg twice a day Continue Propranolol  10 mg twice a day as needed for anxiety Encouraged to start CBT, has appointment scheduled for July.  MDD-in partial remission Currently reports depression symptoms have improved.  Reports sleep is overall better. Continue Duloxetine  80 mg daily Continue sleep hygiene techniques  Attention and focus deficit-patient was referred to Dr. Aleene Hurry , patient no showed his appointment for review of records.  Patient agrees to reach out to them.  Noncompliance with medication regimen-improving Currently  more compliant with medications. Encouraged compliance with recommendations.  Follow-up Follow-up in clinic in 2 months or sooner if needed.    Collaboration of Care: Collaboration of Care: Referral or follow-up with counselor/therapist AEB encouraged to establish care with therapist.  Patient/Guardian was advised Release of Information must be obtained prior to any record release in order to collaborate their care with an outside provider. Patient/Guardian was advised if they have not already done so to contact the registration department to sign all necessary forms in order for us  to release information regarding their care.   Consent: Patient/Guardian gives verbal consent for treatment and assignment of benefits for services provided during this visit. Patient/Guardian expressed understanding and agreed to proceed.  This note was generated in part or whole with voice recognition software. Voice recognition is usually quite accurate but there are transcription errors that can and very often do occur. I apologize for any typographical errors that were not detected and corrected.     Zakry Caso, MD 09/12/2023, 2:38 PM

## 2023-09-27 DIAGNOSIS — Z1389 Encounter for screening for other disorder: Secondary | ICD-10-CM | POA: Diagnosis not present

## 2023-09-27 DIAGNOSIS — K219 Gastro-esophageal reflux disease without esophagitis: Secondary | ICD-10-CM | POA: Diagnosis not present

## 2023-09-27 DIAGNOSIS — R0789 Other chest pain: Secondary | ICD-10-CM | POA: Diagnosis not present

## 2023-09-27 DIAGNOSIS — E1042 Type 1 diabetes mellitus with diabetic polyneuropathy: Secondary | ICD-10-CM | POA: Diagnosis not present

## 2023-09-27 DIAGNOSIS — F17210 Nicotine dependence, cigarettes, uncomplicated: Secondary | ICD-10-CM | POA: Diagnosis not present

## 2023-09-27 DIAGNOSIS — K900 Celiac disease: Secondary | ICD-10-CM | POA: Diagnosis not present

## 2023-09-27 DIAGNOSIS — K529 Noninfective gastroenteritis and colitis, unspecified: Secondary | ICD-10-CM | POA: Diagnosis not present

## 2023-09-27 DIAGNOSIS — R140 Abdominal distension (gaseous): Secondary | ICD-10-CM | POA: Diagnosis not present

## 2023-09-27 DIAGNOSIS — R5383 Other fatigue: Secondary | ICD-10-CM | POA: Diagnosis not present

## 2023-09-27 DIAGNOSIS — Z794 Long term (current) use of insulin: Secondary | ICD-10-CM | POA: Diagnosis not present

## 2023-09-27 DIAGNOSIS — E310 Autoimmune polyglandular failure: Secondary | ICD-10-CM | POA: Diagnosis not present

## 2023-09-27 DIAGNOSIS — R110 Nausea: Secondary | ICD-10-CM | POA: Diagnosis not present

## 2023-09-29 ENCOUNTER — Other Ambulatory Visit: Payer: Self-pay | Admitting: Psychiatry

## 2023-09-29 DIAGNOSIS — F411 Generalized anxiety disorder: Secondary | ICD-10-CM

## 2023-09-29 DIAGNOSIS — F32A Depression, unspecified: Secondary | ICD-10-CM

## 2023-10-04 DIAGNOSIS — E785 Hyperlipidemia, unspecified: Secondary | ICD-10-CM | POA: Diagnosis not present

## 2023-10-04 DIAGNOSIS — Z4681 Encounter for fitting and adjustment of insulin pump: Secondary | ICD-10-CM | POA: Diagnosis not present

## 2023-10-04 DIAGNOSIS — E1042 Type 1 diabetes mellitus with diabetic polyneuropathy: Secondary | ICD-10-CM | POA: Diagnosis not present

## 2023-10-04 DIAGNOSIS — Z794 Long term (current) use of insulin: Secondary | ICD-10-CM | POA: Diagnosis not present

## 2023-10-05 ENCOUNTER — Encounter (HOSPITAL_COMMUNITY): Payer: Self-pay

## 2023-10-05 ENCOUNTER — Ambulatory Visit (INDEPENDENT_AMBULATORY_CARE_PROVIDER_SITE_OTHER): Admitting: Clinical

## 2023-10-05 DIAGNOSIS — F419 Anxiety disorder, unspecified: Secondary | ICD-10-CM | POA: Diagnosis not present

## 2023-10-05 DIAGNOSIS — F331 Major depressive disorder, recurrent, moderate: Secondary | ICD-10-CM
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
## 2023-10-10 ENCOUNTER — Telehealth (HOSPITAL_COMMUNITY): Payer: Self-pay | Admitting: *Deleted

## 2023-10-10 NOTE — Telephone Encounter (Signed)
 Called patient to resch 10-30-2023 appt due to provider not being in office. Unable to reach patient. LMOM and office number provided.

## 2023-10-11 ENCOUNTER — Other Ambulatory Visit: Payer: Self-pay | Admitting: Psychiatry

## 2023-10-11 DIAGNOSIS — F411 Generalized anxiety disorder: Secondary | ICD-10-CM

## 2023-10-11 DIAGNOSIS — F330 Major depressive disorder, recurrent, mild: Secondary | ICD-10-CM

## 2023-10-24 DIAGNOSIS — E785 Hyperlipidemia, unspecified: Secondary | ICD-10-CM | POA: Diagnosis not present

## 2023-10-24 DIAGNOSIS — E310 Autoimmune polyglandular failure: Secondary | ICD-10-CM | POA: Diagnosis not present

## 2023-10-24 DIAGNOSIS — G629 Polyneuropathy, unspecified: Secondary | ICD-10-CM | POA: Diagnosis not present

## 2023-10-24 DIAGNOSIS — F419 Anxiety disorder, unspecified: Secondary | ICD-10-CM | POA: Diagnosis not present

## 2023-10-24 DIAGNOSIS — F329 Major depressive disorder, single episode, unspecified: Secondary | ICD-10-CM | POA: Diagnosis not present

## 2023-10-24 DIAGNOSIS — E039 Hypothyroidism, unspecified: Secondary | ICD-10-CM | POA: Diagnosis not present

## 2023-10-24 DIAGNOSIS — Z0000 Encounter for general adult medical examination without abnormal findings: Secondary | ICD-10-CM | POA: Diagnosis not present

## 2023-10-24 DIAGNOSIS — E1042 Type 1 diabetes mellitus with diabetic polyneuropathy: Secondary | ICD-10-CM | POA: Diagnosis not present

## 2023-10-24 DIAGNOSIS — Z4681 Encounter for fitting and adjustment of insulin pump: Secondary | ICD-10-CM | POA: Diagnosis not present

## 2023-10-24 DIAGNOSIS — G4730 Sleep apnea, unspecified: Secondary | ICD-10-CM | POA: Diagnosis not present

## 2023-10-24 DIAGNOSIS — E538 Deficiency of other specified B group vitamins: Secondary | ICD-10-CM | POA: Diagnosis not present

## 2023-10-24 DIAGNOSIS — F17210 Nicotine dependence, cigarettes, uncomplicated: Secondary | ICD-10-CM | POA: Diagnosis not present

## 2023-10-26 ENCOUNTER — Telehealth: Payer: Self-pay

## 2023-10-26 NOTE — Telephone Encounter (Signed)
 Email received as documented below. Sent to wrong office. Information sent to Dr. Nichole. Mjp,lpn  From: Parnell, Megan [email or web address]  Sent: Wednesday, October 25, 2023 3:48 PM To: Angelena Ronal Bradley [email or web address]> Cc: Lynwood Kirsch [email or web address]> Subject: BSFM Patient (secure)  Hello,  I was reviewing the commercial gap report today and there is one patient who has an open gap for GSD and needs an A1C < 8.0 but has not one in 2025 and has no upcoming appts.  [name] 1986/03/25 [employer]  Getting him in before end of year would be best in case his A1C is out of range so that there may be time to get it controlled and retested.   Let me know if you have any questions! ??  Megan Parnell, CCMA, CQS [place]  [facility] Quality Specialist [address] [place] [address] Phone: [phone] Fax: [phone]

## 2023-10-30 ENCOUNTER — Ambulatory Visit (HOSPITAL_COMMUNITY): Admitting: Clinical

## 2023-11-03 ENCOUNTER — Other Ambulatory Visit: Payer: Self-pay | Admitting: Psychiatry

## 2023-11-03 DIAGNOSIS — R41840 Attention and concentration deficit: Secondary | ICD-10-CM

## 2023-11-13 ENCOUNTER — Telehealth (INDEPENDENT_AMBULATORY_CARE_PROVIDER_SITE_OTHER): Payer: Self-pay | Admitting: Psychiatry

## 2023-11-13 DIAGNOSIS — F419 Anxiety disorder, unspecified: Secondary | ICD-10-CM

## 2023-11-13 DIAGNOSIS — F331 Major depressive disorder, recurrent, moderate: Secondary | ICD-10-CM

## 2023-11-13 NOTE — Progress Notes (Signed)
 No response to call or text or video invite

## 2024-01-09 ENCOUNTER — Telehealth: Payer: Self-pay | Admitting: Psychiatry

## 2024-01-09 ENCOUNTER — Other Ambulatory Visit: Payer: Self-pay | Admitting: Psychiatry

## 2024-01-09 DIAGNOSIS — F411 Generalized anxiety disorder: Secondary | ICD-10-CM

## 2024-01-09 DIAGNOSIS — F330 Major depressive disorder, recurrent, mild: Secondary | ICD-10-CM

## 2024-01-09 DIAGNOSIS — F32A Depression, unspecified: Secondary | ICD-10-CM

## 2024-01-09 NOTE — Telephone Encounter (Signed)
 Mychart message sent to patient. Message left on patient voice mailbox to call the office to schedule the missed appointment from August. He has until Friday, October 17 at noon to call and verify if he wished to continue care in order to get medication refills

## 2024-01-31 ENCOUNTER — Encounter: Payer: Self-pay | Admitting: Psychiatry

## 2024-01-31 ENCOUNTER — Other Ambulatory Visit: Payer: Self-pay | Admitting: Psychiatry

## 2024-01-31 DIAGNOSIS — F32A Depression, unspecified: Secondary | ICD-10-CM

## 2024-01-31 DIAGNOSIS — F411 Generalized anxiety disorder: Secondary | ICD-10-CM

## 2024-01-31 MED ORDER — DULOXETINE HCL 20 MG PO CPEP
20.0000 mg | ORAL_CAPSULE | Freq: Every day | ORAL | 0 refills | Status: AC
Start: 2024-01-31 — End: 2024-03-01

## 2024-01-31 MED ORDER — BUSPIRONE HCL 10 MG PO TABS: 10.0000 mg | ORAL_TABLET | Freq: Two times a day (BID) | ORAL | 0 refills | Status: AC

## 2024-01-31 MED ORDER — PROPRANOLOL HCL 10 MG PO TABS: 10.0000 mg | ORAL_TABLET | Freq: Two times a day (BID) | ORAL | 0 refills | Status: AC | PRN

## 2024-01-31 NOTE — Telephone Encounter (Signed)
 I have sent 30-day supply of his medications like Cymbalta  20 mg, BuSpar  as well as propranolol  as needed.  I have printed out a letter with resources.

## 2024-02-22 ENCOUNTER — Other Ambulatory Visit: Payer: Self-pay | Admitting: Psychiatry

## 2024-02-22 DIAGNOSIS — F411 Generalized anxiety disorder: Secondary | ICD-10-CM

## 2024-02-29 ENCOUNTER — Other Ambulatory Visit: Payer: Self-pay | Admitting: Psychiatry

## 2024-02-29 DIAGNOSIS — F32A Depression, unspecified: Secondary | ICD-10-CM

## 2024-02-29 DIAGNOSIS — F411 Generalized anxiety disorder: Secondary | ICD-10-CM
